# Patient Record
Sex: Female | Born: 1955
Health system: Southern US, Community
[De-identification: ages and names within clinical notes are randomized; demographics above are authoritative.]

## PROBLEM LIST (undated history)

## (undated) DIAGNOSIS — M5020 Other cervical disc displacement, unspecified cervical region: Secondary | ICD-10-CM

## (undated) DIAGNOSIS — I10 Essential (primary) hypertension: Secondary | ICD-10-CM

## (undated) DIAGNOSIS — L409 Psoriasis, unspecified: Secondary | ICD-10-CM

## (undated) HISTORY — DX: Essential (primary) hypertension: I10

## (undated) HISTORY — PX: BREAST CYST ASPIRATION: SHX578

## (undated) HISTORY — PX: TUBAL LIGATION: SHX77

## (undated) HISTORY — DX: Psoriasis, unspecified: L40.9

## (undated) HISTORY — DX: Other cervical disc displacement, unspecified cervical region: M50.20

---

## 2013-08-04 LAB — RAPID HIV SCREEN (HIV 1/2 AB+AG): HIV 1/2 ANTIBODIES: NONREACTIVE

## 2013-08-04 LAB — BASIC METABOLIC PANEL
BUN: 11 mg/dL (ref 4–21)
CREATININE: 0.7 mg/dL (ref 0.5–1.1)
Potassium: 3.9 mmol/L (ref 3.4–5.3)
Sodium: 139 mmol/L (ref 137–147)

## 2013-08-04 LAB — HEPATITIS C ANTIBODY: HEP C VIRUS AB: NEGATIVE

## 2013-08-04 LAB — COMPLETE METABOLIC PANEL WITH GFR: CHLORIDE: 101 mmol/L

## 2013-08-04 LAB — TSH: TSH: 1.17 u[IU]/mL (ref 0.41–5.90)

## 2013-08-04 LAB — HEMOGLOBIN A1C: Hgb A1c MFr Bld: 5.6 % (ref 4.0–6.0)

## 2013-08-05 LAB — T4, FREE: T4,Free (Direct): 0.8

## 2013-08-21 LAB — FECAL OCCULT BLOOD, GUAIAC: Fecal Occult Blood: NEGATIVE

## 2013-08-26 LAB — HM MAMMOGRAPHY: HM Mammogram: NEGATIVE

## 2013-12-09 LAB — HEPATIC FUNCTION PANEL
ALT: 17 U/L (ref 7–35)
AST: 20 U/L (ref 13–35)
Alkaline Phosphatase: 43 U/L (ref 25–125)
Bilirubin, Total: 0.6 mg/dL

## 2013-12-09 LAB — BASIC METABOLIC PANEL
CREATININE: 0.5 mg/dL (ref 0.5–1.1)
Glucose: 119 mg/dL
Potassium: 4.2 mmol/L (ref 3.4–5.3)
SODIUM: 141 mmol/L (ref 137–147)

## 2014-08-06 LAB — HEPATIC FUNCTION PANEL
ALT: 28 U/L (ref 7–35)
AST: 27 U/L (ref 13–35)
Alkaline Phosphatase: 46 U/L (ref 25–125)
BILIRUBIN, TOTAL: 0.4 mg/dL

## 2014-08-06 LAB — LIPID PANEL
Cholesterol: 173 mg/dL (ref 0–200)
HDL: 88 mg/dL — AB (ref 35–70)
LDL Cholesterol: 76 mg/dL
Triglycerides: 44 mg/dL (ref 40–160)

## 2014-08-06 LAB — TSH: TSH: 1.14 u[IU]/mL (ref 0.41–5.90)

## 2014-08-06 LAB — BASIC METABOLIC PANEL
Creatinine: 0.6 mg/dL (ref 0.5–1.1)
POTASSIUM: 4.2 mmol/L (ref 3.4–5.3)
Sodium: 140 mmol/L (ref 137–147)

## 2014-09-22 ENCOUNTER — Ambulatory Visit (INDEPENDENT_AMBULATORY_CARE_PROVIDER_SITE_OTHER): Payer: PRIVATE HEALTH INSURANCE | Admitting: Family Medicine

## 2014-09-22 ENCOUNTER — Encounter: Payer: Self-pay | Admitting: Family Medicine

## 2014-09-22 VITALS — BP 121/73 | HR 73 | Ht 66.0 in | Wt 140.0 lb

## 2014-09-22 DIAGNOSIS — M722 Plantar fascial fibromatosis: Secondary | ICD-10-CM

## 2014-09-22 DIAGNOSIS — G4733 Obstructive sleep apnea (adult) (pediatric): Secondary | ICD-10-CM | POA: Diagnosis not present

## 2014-09-22 DIAGNOSIS — G5603 Carpal tunnel syndrome, bilateral upper limbs: Secondary | ICD-10-CM | POA: Insufficient documentation

## 2014-09-22 DIAGNOSIS — Z1231 Encounter for screening mammogram for malignant neoplasm of breast: Secondary | ICD-10-CM

## 2014-09-22 DIAGNOSIS — L409 Psoriasis, unspecified: Secondary | ICD-10-CM

## 2014-09-22 DIAGNOSIS — G5601 Carpal tunnel syndrome, right upper limb: Secondary | ICD-10-CM

## 2014-09-22 DIAGNOSIS — G5600 Carpal tunnel syndrome, unspecified upper limb: Secondary | ICD-10-CM | POA: Insufficient documentation

## 2014-09-22 MED ORDER — FLUTICASONE PROPIONATE 50 MCG/ACT NA SUSP
1.0000 | Freq: Every day | NASAL | Status: DC
Start: 2014-09-22 — End: 2016-01-04

## 2014-09-22 MED ORDER — CLOBETASOL PROPIONATE 0.05 % EX CREA: 1.0000 "application " | TOPICAL_CREAM | Freq: Every day | CUTANEOUS | Status: DC | PRN

## 2014-09-22 MED ORDER — LORAZEPAM 0.5 MG PO TABS: 0.5000 mg | ORAL_TABLET | Freq: Every day | ORAL | Status: DC | PRN

## 2014-09-22 NOTE — Progress Notes (Signed)
Subjective:    Patient ID: Desiree Hughes, female    DOB: 12/26/1955, 59 y.o.   MRN: [id number]  HPI  patient recently moved to [place] and is here to establish primary care. She has a past medical history significant for sleep apnea and she does wear CPAP. She also has a herniated disc and has had blood pressure problems in the past.      Sleep apnea - on CPAP for about 4 years.  Took trazodone for 5-6 years and doesn't feel ike it really worked well and then would feel groggy in the Am.    Anxiety /panic attacks.  She uses it 2-3 times per week. Says occ uses it for sleep too.  Says she worries a fair amount.     She also reports a history of bilateral plantar fasciitis as well as right carpal tunnel syndrome. She's actually been having some pain in the pinky on the left hand recently. She scratches it is more of a sharp intermittent pain and when she rubs it feels better. No numbness or tickling in the pinky.   She also complains of a rash in the left antecubital fossa. She says initially it was a few blisters and then spread to a confluent erythematous area. She said there was never any drainage or weeping. It has been getting a little bit better. She's been applying some calamine lotion.  Review of Systems  Constitutional: Negative for fever, diaphoresis and unexpected weight change.  HENT: Positive for hearing loss. Negative for rhinorrhea and tinnitus.   Eyes: Negative for visual disturbance.  Respiratory: Negative for cough and wheezing.   Cardiovascular: Negative for chest pain and palpitations.  Gastrointestinal: Negative for nausea, vomiting, diarrhea and blood in stool.  Genitourinary: Negative for vaginal bleeding, vaginal discharge and difficulty urinating.       + nighttime urination  Musculoskeletal: Positive for myalgias. Negative for arthralgias.  Skin: Negative for rash.  Neurological: Positive for headaches.  Hematological: Negative for adenopathy. Does not  bruise/bleed easily.  Psychiatric/Behavioral: Positive for sleep disturbance. Negative for dysphoric mood. The patient is nervous/anxious.    BP 121/73 mmHg  Pulse 73  Ht  (1.676 m)  Wt 140 lb (63.504 kg)  BMI 22.61 kg/m2    Allergies  Allergen Reactions  . Lisinopril Cough    Past Medical History  Diagnosis Date  . Hypertension   . Herniated disc, cervical     Past Surgical History  Procedure Laterality Date  . Tubal ligation      History   Social History  . Marital Status: Married    Spouse Name: N/A  . Number of Children: N/A  . Years of Education: N/A   Occupational History  . Not on file.   Social History Main Topics  . Smoking status: Never Smoker   . Smokeless tobacco: Not on file  . Alcohol Use: No  . Drug Use: No  . Sexual Activity: No   Other Topics Concern  . Not on file   Social History Narrative  . No narrative on file    Family History  Problem Relation Age of Onset  . Alcohol abuse Father   . Cancer Father   . Heart disease Father   . Heart disease Brother   . Depression Son   . Diabetes Son     Outpatient Encounter Prescriptions as of 09/22/2014  Medication Sig  . B Complex-C (SUPER B COMPLEX PO) Take by mouth.  . betamethasone valerate (  VALISONE) 0.1 % cream Apply topically daily.  Marland Kitchen. BIOTIN PO Take by mouth.  Marland Kitchen. CALCIUM PO Take by mouth.  . Cholecalciferol (VITAMIN D-3 PO) Take by mouth.  . clobetasol cream (TEMOVATE) 0.05 % Apply 1 application topically daily as needed.  . Flax OIL Take by mouth.  . fluticasone (FLONASE) 50 MCG/ACT nasal spray Place 1 spray into both nostrils daily.  Marland Kitchen. HALOBETASOL PROPIONATE EX Apply topically.  Marland Kitchen. HYDROcodone-acetaminophen (NORCO/VICODIN) 5-325 MG per tablet Take 1 tablet by mouth daily as needed for moderate pain.  Marland Kitchen. ibuprofen (ADVIL,MOTRIN) 800 MG tablet Take 800 mg by mouth every 8 (eight) hours as needed.  Marland Kitchen. LORazepam (ATIVAN) 0.5 MG tablet Take 1 tablet (0.5 mg total) by mouth daily as  needed for anxiety.  Marland Kitchen. MELATONIN PO Take by mouth.  Marland Kitchen. MILK THISTLE PO Take by mouth.  . Multiple Vitamins-Minerals (CENTRUM SILVER ULTRA WOMENS PO) Take by mouth.  . nystatin cream (MYCOSTATIN) Apply 1 application topically 2 (two) times daily.  . Omega-3 Fatty Acids (FISH OIL PO) Take by mouth.  . vitamin C (ASCORBIC ACID) 500 MG tablet Take 500 mg by mouth daily.  . [DISCONTINUED] clobetasol cream (TEMOVATE) 0.05 % Apply 1 application topically 2 (two) times daily.  . [DISCONTINUED] fluticasone (FLONASE) 50 MCG/ACT nasal spray Place 2 sprays into both nostrils daily.  . [DISCONTINUED] LORazepam (ATIVAN) 0.5 MG tablet Take 0.5 mg by mouth daily as needed for anxiety.   No facility-administered encounter medications on file as of 09/22/2014.           Objective:   Physical Exam  Constitutional: She is oriented to person, place, and time. She appears well-developed and well-nourished.  HENT:  Head: Normocephalic and atraumatic.  Cardiovascular: Normal rate, regular rhythm and normal heart sounds.   Pulmonary/Chest: Effort normal and breath sounds normal.  Neurological: She is alert and oriented to person, place, and time.  Skin: Skin is warm and dry.  eirythematous raised red rash on left antecubetal fossa.   Psychiatric: She has a normal mood and affect. Her behavior is normal.          Assessment & Plan:  Sleep apnea -  We'll work on trying to get copy of sleep study so that we can place referral so she can start to get new supplies etc. Here locally. In that way if we need to do a download yearly we can work through the local agency to do that.  Rash - likely poison ivey.   Will refill topical sterile cream to use as needed. She's used these before for her psoriasis and his her with those.  Mammogram due - order placed today. They will contact her.    Anxiety/panic attacks.   She's currently using her  Ativan a few times a week. Reminded her about the potential for  dependency and to make sure that she's using it sparingly. If she starts noticing that she's using it more frequently than encouraged her to come and make an appointment and we can discuss on putting her on something prophylactically. I did refill the medication today.

## 2014-10-07 ENCOUNTER — Encounter: Payer: Self-pay | Admitting: Family Medicine

## 2014-10-07 DIAGNOSIS — G894 Chronic pain syndrome: Secondary | ICD-10-CM | POA: Insufficient documentation

## 2014-10-07 DIAGNOSIS — K219 Gastro-esophageal reflux disease without esophagitis: Secondary | ICD-10-CM | POA: Insufficient documentation

## 2014-10-14 ENCOUNTER — Ambulatory Visit (INDEPENDENT_AMBULATORY_CARE_PROVIDER_SITE_OTHER): Payer: PRIVATE HEALTH INSURANCE

## 2014-10-14 ENCOUNTER — Other Ambulatory Visit: Payer: Self-pay | Admitting: Family Medicine

## 2014-10-14 DIAGNOSIS — Z1231 Encounter for screening mammogram for malignant neoplasm of breast: Secondary | ICD-10-CM

## 2014-10-14 MED ORDER — AMBULATORY NON FORMULARY MEDICATION: Status: DC

## 2014-10-29 ENCOUNTER — Telehealth: Payer: Self-pay | Admitting: Family Medicine

## 2014-10-29 MED ORDER — NYSTATIN-TRIAMCINOLONE 100000-0.1 UNIT/GM-% EX OINT: 1.0000 "application " | TOPICAL_OINTMENT | Freq: Two times a day (BID) | CUTANEOUS | Status: DC

## 2014-10-29 NOTE — Telephone Encounter (Signed)
Pt informed. Pt asked that a rx for nystatin-triamcinolone cream be sent to her pharmacy. She stated that her previous dr gave her this and forgot to ask for refill when she came in. rx sent.Desiree Hughes, Desiree Hughes .Desiree Hughes, Desiree Hughes

## 2014-10-29 NOTE — Telephone Encounter (Signed)
Call pt: I received compliance report for CPAP and looks like she is doing a great job using it.  Her pressure is set to 8.0 cmH20 and is working well to resolve her apneas

## 2014-11-04 ENCOUNTER — Encounter: Payer: Self-pay | Admitting: Family Medicine

## 2014-11-17 ENCOUNTER — Encounter: Payer: Self-pay | Admitting: Family Medicine

## 2014-12-29 ENCOUNTER — Encounter: Payer: Self-pay | Admitting: Family Medicine

## 2014-12-29 ENCOUNTER — Ambulatory Visit (INDEPENDENT_AMBULATORY_CARE_PROVIDER_SITE_OTHER): Payer: PRIVATE HEALTH INSURANCE | Admitting: Family Medicine

## 2014-12-29 VITALS — BP 102/62 | HR 71 | Temp 98.3°F | Ht 66.0 in | Wt 143.0 lb

## 2014-12-29 DIAGNOSIS — M47812 Spondylosis without myelopathy or radiculopathy, cervical region: Secondary | ICD-10-CM

## 2014-12-29 DIAGNOSIS — S161XXA Strain of muscle, fascia and tendon at neck level, initial encounter: Secondary | ICD-10-CM

## 2014-12-29 DIAGNOSIS — G4700 Insomnia, unspecified: Secondary | ICD-10-CM | POA: Diagnosis not present

## 2014-12-29 DIAGNOSIS — G4733 Obstructive sleep apnea (adult) (pediatric): Secondary | ICD-10-CM | POA: Diagnosis not present

## 2014-12-29 DIAGNOSIS — Z23 Encounter for immunization: Secondary | ICD-10-CM

## 2014-12-29 DIAGNOSIS — Z8659 Personal history of other mental and behavioral disorders: Secondary | ICD-10-CM | POA: Insufficient documentation

## 2014-12-29 DIAGNOSIS — F411 Generalized anxiety disorder: Secondary | ICD-10-CM

## 2014-12-29 DIAGNOSIS — Z9989 Dependence on other enabling machines and devices: Secondary | ICD-10-CM

## 2014-12-29 MED ORDER — SUVOREXANT 15 MG PO TABS: 15.0000 mg | ORAL_TABLET | Freq: Every evening | ORAL | Status: DC | PRN

## 2014-12-29 NOTE — Progress Notes (Addendum)
   Subjective:    Patient ID: Desiree Hughes, female    DOB: 1955/05/21, 59 y.o.   MRN: [id number]  HPI  Not sleeping well. Hard time falling asleep and staying asleep. Walks aobut 5 miels per day. Wears her CPAP regularly. Still not sleping well. Only getting about 3 hours per night.  Says starting to feel lonely now that there is not family around.  She does use melatonin.  Says the ambien caused drosiness. Was on trazodone for years. Made her feel groggy as well.  Just feels really tired all the time.     Complains of Neck Pain-  For several weeks.  Says it triggering headaches for her. She's been taking ibuprofen and Aleve but really feels like she's been taking it too much. No specific injuries but she says she has arthritis and degenerative disc disease in her neck that she's aware of.  Anxiety-she is doing well overall. She still takes her lorazepam about twice per week. Most the time is to help her sleep. Not really for panic attacks.  Review of Systems     Objective:   Physical Exam  Constitutional: She is oriented to person, place, and time. She appears well-developed and well-nourished.  HENT:  Head: Normocephalic and atraumatic.  Cardiovascular: Normal rate, regular rhythm and normal heart sounds.   Pulmonary/Chest: Effort normal and breath sounds normal.  Musculoskeletal:  Normal cervical flexion and extension. Decreased rotation to the right compared the left. Normal side bending right and left.  Neurological: She is alert and oriented to person, place, and time.  Skin: Skin is warm and dry.  Psychiatric: She has a normal mood and affect. Her behavior is normal.          Assessment & Plan:  Insomnia  - we'll try sending her prescription for Belsomra. May not be covered with her insurance. She had side effects with trazodone and Ambien. We reviewed some good sleep hygiene. Reactions sounds like she is really doing great as far as that is concerned. She limits any caffeine  after 10 AM. To trust ago to bed and get she feels like her sleep environment is conducive to sleep. She says occasionally it's challenging if her forget husband forgets to wear his CPAP.  OSA -  She was able to get her supplies etc. And has been wearing her  CPAP consistently. Though only getting a few hours of sleep.   Cervical pain.  Recommend referral to physical therapy for further evaluation and treatment. This should also improve her headaches as well. If not improving after 4-5 weeks of physical therapy consider further imaging.   GAD - gad 7 score of 17. Rates her symptoms is somewhat difficult. Says that she really may benefit from being on something to help reduce her overall anxiety.

## 2014-12-31 ENCOUNTER — Ambulatory Visit (INDEPENDENT_AMBULATORY_CARE_PROVIDER_SITE_OTHER): Payer: No Typology Code available for payment source | Admitting: Rehabilitative and Restorative Service Providers"

## 2014-12-31 ENCOUNTER — Encounter: Payer: Self-pay | Admitting: Rehabilitative and Restorative Service Providers"

## 2014-12-31 DIAGNOSIS — R293 Abnormal posture: Secondary | ICD-10-CM

## 2014-12-31 DIAGNOSIS — M436 Torticollis: Secondary | ICD-10-CM

## 2014-12-31 DIAGNOSIS — G44229 Chronic tension-type headache, not intractable: Secondary | ICD-10-CM | POA: Diagnosis not present

## 2014-12-31 DIAGNOSIS — Z7409 Other reduced mobility: Secondary | ICD-10-CM | POA: Diagnosis not present

## 2014-12-31 NOTE — Patient Instructions (Signed)
Axial Extension (Chin Tuck)   Pull chin in and lengthen back of neck. Hold __10__ seconds while counting out loud. Repeat __10__ times. Do __several__ sessions per day.       Scapular Retraction (Standing)   With arms at sides, pinch shoulder blades together. Hold 10 sec Repeat _10__ times per set. Do __1-3__ sets per session. Do _several ___ sessions per day. Can use pool noodle  Scapula Adduction With Pectorals, Low   Stand in doorframe with palms against frame and arms at 45. Lean forward and squeeze shoulder blades. Hold _20__ seconds. Repeat _3__ times per session. Do _3__ sessions per day.  .    Scapula Adduction With Pectorals, Mid-Range   Stand in doorframe with palms against frame and arms at 90. Lean forward and squeeze shoulder blades. Hold _20__ seconds. Repeat _3__ times per session. Do _2-3__ sessions per day.    Scapula Adduction With Pectorals, High   Stand in doorframe with palms against frame and arms at 120. Lean forward and squeeze shoulder blades. Hold 20__ seconds. Repeat _3__ times per session. Do 2-3___ sessions per day.

## 2014-12-31 NOTE — Therapy (Signed)
[facility] [place] [address] [place], [place], [address] Phone: [phone]   Fax:  [phone]  Physical Therapy Evaluation  Patient Details  Name: Desiree Hughes MRN: [id number] Date of Birth: 1955/05/09 Referring Provider:  Agapito Games, *  Encounter Date: 12/31/2014      PT End of Session - 12/31/14 1306    Visit Number 1   Number of Visits 12   Date for PT Re-Evaluation 02/11/15   PT Start Time 1021   PT Stop Time 1114   PT Time Calculation (min) 53 min   Activity Tolerance Patient tolerated treatment well      Past Medical History  Diagnosis Date  . Hypertension   . Herniated disc, cervical     Past Surgical History  Procedure Laterality Date  . Tubal ligation      There were no vitals filed for this visit.  Visit Diagnosis:  Chronic tension-type headache, not intractable - Plan: PT plan of care cert/re-cert  Stiff neck - Plan: PT plan of care cert/re-cert  Abnormal posture - Plan: PT plan of care cert/re-cert  Decreased mobility and endurance - Plan: PT plan of care cert/re-cert      Subjective Assessment - 12/31/14 1025    Subjective Patient reports that hse has had neck problems for several years but has noticed increaed tightness and onset of HA's in past two months. She has some dizziness if she tilts her neck too far back.    Pertinent History History of sciatica; HNP lumbar spine no surgery; insomnia/CPAP   How long can you sit comfortably? 1- 1 1/2 hours    How long can you stand comfortably? 15-20 min    How long can you walk comfortably? no limit    Diagnostic tests none - xrays years ago showed arthritis neck and shoulder   Patient Stated Goals get rid of HA's and improve flexibility of neck            [facility] PT Assessment - 12/31/14 0001    Assessment   Medical Diagnosis Cervical strain   Onset Date/Surgical Date 11/07/14   Hand Dominance Right   Next MD Visit 6 months   Prior Therapy none   Precautions   Precautions None   Balance Screen   Has the patient fallen in the past 6 months No   Has the patient had a decrease in activity level because of a fear of falling?  No   Is the patient reluctant to leave their home because of a fear of falling?  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[employer], [employer]    Leisure walking 3-5 miles/day; household chores; cooking   Observation/Other Assessments   Focus on Therapeutic Outcomes (FOTO)  54% limitation   Sensation   Additional Comments WFL's   Posture/Postural Control   Posture Comments head forward; shoulders rounded and elevated; head of the humerus anterior in orientation; increased thoracic kyphosis   AROM   Overall AROM Comments shoulder ROM  WFL's throughout   Cervical Flexion 34   Cervical Extension 51   Cervical - Right Side Bend 25   Cervical - Left Side Bend 34   Cervical - Right Rotation 48   Cervical - Left Rotation 40   Strength   Overall Strength Comments 5/5 bilat UE's  Palpation   Palpation comment muscular tightness ant/lat/post cervical musculature; upper traps; leveator; occipital area; pecs bilat                   [facility] Adult PT Treatment/Exercise - 12/31/14 0001    Self-Care   Self-Care --  postural correction/ergonomic modifications   Neck Exercises: Standing   Neck Retraction 5 reps;10 secs   Shoulder Exercises: Stretch   Corner Stretch Limitations doorway 3 positions 30 sec hold 2 reps each   Other Shoulder Stretches scap squeeze with noodle 10 sec hold 10 reps   Moist Heat Therapy   Number Minutes Moist Heat 15 Minutes   Moist Heat Location Cervical  thoracic   Electrical Stimulation   Electrical Stimulation Location cervical upper trap bilat   Electrical Stimulation Action IFC   Electrical  Stimulation Parameters to tolerance   Electrical Stimulation Goals Pain  muscular tightness                PT Education - 12/31/14 1104    Education provided Yes   Education Details Postural correction; HEP; ergonomic correction for home   Person(s) Educated Patient   Methods Explanation;Demonstration;Tactile cues;Verbal cues;Handout   Comprehension Verbalized understanding;Returned demonstration;Verbal cues required;Tactile cues required             PT Long Term Goals - 12/31/14 1313    PT LONG TERM GOAL #1   Title Patient I in HEP for dischage 02/11/15   Time 6   Period Weeks   Status New   PT LONG TERM GOAL #2   Title Cervical ROM WFL's and pain free 02/11/15   Time 6   Period Weeks   Status New   PT LONG TERM GOAL #3   Title improve posture and alignment with good cervical and thoracic alignment 02/11/15   Time 6   Period Weeks   Status New   PT LONG TERM GOAL #4   Title decrease frequency, intensity, duration of headaches 02/11/15   Time 6   Period Weeks   PT LONG TERM GOAL #5   Title Decrease FOTO to </=41% limitation 02/11/15   Time 6   Period Weeks   Status New               Plan - 12/31/14 1307    Clinical Impression Statement Patient presents with c/o headaches and tightness through the neck and shoulders. She presents with poor postrure and alignment; limited cervical ROM and mobility; muscular tenderness and tightness to palpation; limited activity level related to headaches. She will benefit from Physical Therpay to address muscular tightness and limited mobility to help with decreasing frequency, intensity and intensity of headaches.   Pt will benefit from skilled therapeutic intervention in order to improve on the following deficits Decreased range of motion;Decreased mobility;Decreased activity tolerance;Decreased endurance;Pain   Rehab Potential Good   PT Frequency 2x / week   PT Duration 6 weeks   PT Treatment/Interventions  Patient/family education;ADLs/Self Care Home Management;Therapeutic exercise;Therapeutic activities;Manual techniques;Cryotherapy;Electrical Stimulation;Moist Heat;Ultrasound;Traction;Dry needling   PT Next Visit Plan trial of manual therapy to address soft tissue tightness; progress with postural strengthening as tolerated; continue education re-spine care; postural correction   PT Home Exercise Plan postural correction; HEP   Consulted and Agree with Plan of Care Patient         Problem List Patient Active Problem List   Diagnosis Date Noted  . GAD (generalized anxiety disorder) 12/29/2014  . GERD (gastroesophageal reflux disease) 10/07/2014  .  Chronic pain syndrome 10/07/2014  . Carpal tunnel syndrome 09/22/2014  . Plantar fasciitis, bilateral 09/22/2014  . Obstructive sleep apnea 09/22/2014  . Psoriasis 09/22/2014    Selina Tapper Rober Minion PT, MPH 12/31/2014, 1:27 PM  [facility] [address] [place], [place], [address] Phone: [phone]   Fax:  [phone]

## 2015-01-05 ENCOUNTER — Telehealth: Payer: Self-pay | Admitting: Family Medicine

## 2015-01-05 ENCOUNTER — Ambulatory Visit (INDEPENDENT_AMBULATORY_CARE_PROVIDER_SITE_OTHER): Payer: PRIVATE HEALTH INSURANCE | Admitting: Physical Therapy

## 2015-01-05 DIAGNOSIS — R293 Abnormal posture: Secondary | ICD-10-CM | POA: Diagnosis not present

## 2015-01-05 DIAGNOSIS — Z7409 Other reduced mobility: Secondary | ICD-10-CM | POA: Diagnosis not present

## 2015-01-05 DIAGNOSIS — M436 Torticollis: Secondary | ICD-10-CM | POA: Diagnosis not present

## 2015-01-05 DIAGNOSIS — G44229 Chronic tension-type headache, not intractable: Secondary | ICD-10-CM | POA: Diagnosis not present

## 2015-01-05 NOTE — Therapy (Signed)
[place], [place], [address] Phone: [phone]   Fax:  [phone]  Physical Therapy Treatment  Patient Details  Name: Desiree Hughes MRN: [id number] Date of Birth: 11/02/1955 Referring Provider:  Hali Marry, *  Encounter Date: 01/05/2015      PT End of Session - 01/05/15 1019    Visit Number 2   Number of Visits 12   Date for PT Re-Evaluation 02/11/15   PT Start Time 1018   PT Stop Time 1119   PT Time Calculation (min) 61 min      Past Medical History  Diagnosis Date  . Hypertension   . Herniated disc, cervical     Past Surgical History  Procedure Laterality Date  . Tubal ligation      There were no vitals filed for this visit.  Visit Diagnosis:  Chronic tension-type headache, not intractable  Stiff neck  Decreased mobility and endurance  Abnormal posture      Subjective Assessment - 01/05/15 1021    Subjective Pt woke up with a headache and it now it is starting to subside. "Neck feels a little more creeky this week".   She reports she is unable to find pool noodle at store and that the chest stretches have caused some increased soreness.      Currently in Pain? Yes   Pain Score 2    Pain Location Neck   Pain Orientation Right;Posterior   Pain Descriptors / Indicators Aching   Aggravating Factors  housework, yardwork    Pain Relieving Factors MHP           [facility] Adult PT Treatment/Exercise - 01/05/15 0001    Exercises   Exercises Neck;Shoulder   Neck Exercises: Machines for Strengthening   Other Machines for Strengthening NuStep with arms/legs L5: 5 min    Neck Exercises: Supine   Upper Extremity D2 10 reps;Flexion;Theraband   Theraband Level (UE D2) Level 2 (Red)   Other Supine Exercise shoulder press/thoracic lift x 3 sec x 10 reps    Other Supine Exercise Head presses x 3 sec hold x 10 reps;  Hooklying on 1/2 foam roll:  bila shoulder abd stretch x 1  min, then 10 snow angel,  horizontal abd with yellow band x 10   Shoulder Exercises: Supine   External Rotation Strengthening;Both;15 reps;Theraband  2 sets   Theraband Level (Shoulder External Rotation) Level 1 (Yellow);Level 2 (Red)   Flexion Strengthening;Both;10 reps;Theraband   Shoulder Exercises: Seated   Other Seated Exercises D2 flexion with yellow band x 5 each rep    Moist Heat Therapy   Number Minutes Moist Heat 15 Minutes   Moist Heat Location Cervical  thoracic   Electrical Stimulation   Electrical Stimulation Location cervical upper trap bilat   Electrical Stimulation Action IFC   Electrical Stimulation Parameters to tolerance   Electrical Stimulation Goals Pain   Manual Therapy   Manual Therapy Myofascial release   Manual therapy comments Noted tight/point tender in Rt cervical paraspinals ~C4 and upper trap, suboccipital   Myofascial Release to bilateral cervical paraspinals and suboccitipal muscles; gentle suboccipital release and manual traction.    Neck Exercises: Stretches   Neck Stretch 2 reps;10 seconds  lateral neck flexion            PT Education - 01/05/15 1227    Education provided Yes   Education Details HEP    Person(s) Educated Patient   Methods Explanation;Demonstration;Handout   Comprehension  Returned demonstration;Verbalized understanding             PT Long Term Goals - 12/31/14 1313    PT LONG TERM GOAL #1   Title Patient I in HEP for dischage 02/11/15   Time 6   Period Weeks   Status New   PT LONG TERM GOAL #2   Title Cervical ROM WFL's and pain free 02/11/15   Time 6   Period Weeks   Status New   PT LONG TERM GOAL #3   Title improve posture and alignment with good cervical and thoracic alignment 02/11/15   Time 6   Period Weeks   Status New   PT LONG TERM GOAL #4   Title decrease frequency, intensity, duration of headaches 02/11/15   Time 6   Period Weeks   PT LONG TERM GOAL #5   Title Decrease FOTO to </=41%  limitation 02/11/15   Time 6   Period Weeks   Status New               Plan - 01/05/15 1227    Clinical Impression Statement Pt tolerated all exercises well, with minimal increase in pain.  Pt had some increased neck tightness, but this subsided with rest.   Pt reported decreased pain/tightness in neck with manual therapy and MHP/estim.  No goals met, only second visit .   Pt will benefit from skilled therapeutic intervention in order to improve on the following deficits Decreased range of motion;Decreased mobility;Decreased activity tolerance;Decreased endurance;Pain   Rehab Potential Good   PT Frequency 2x / week   PT Duration 6 weeks   PT Treatment/Interventions Patient/family education;ADLs/Self Care Home Management;Therapeutic exercise;Therapeutic activities;Manual techniques;Cryotherapy;Electrical Stimulation;Moist Heat;Ultrasound;Traction;Dry needling   PT Next Visit Plan progress with postural strengthening as tolerated; continue education re-spine care; postural correction; manual therapy to address soft tissue tightness.    Consulted and Agree with Plan of Care Patient        Problem List Patient Active Problem List   Diagnosis Date Noted  . GAD (generalized anxiety disorder) 12/29/2014  . GERD (gastroesophageal reflux disease) 10/07/2014  . Chronic pain syndrome 10/07/2014  . Carpal tunnel syndrome 09/22/2014  . Plantar fasciitis, bilateral 09/22/2014  . Obstructive sleep apnea 09/22/2014  . Psoriasis 09/22/2014    Kerin Perna, PTA 01/05/2015 12:33 PM  [place], [place], [address] Phone: [phone]   Fax:  [phone]

## 2015-01-05 NOTE — Telephone Encounter (Signed)
Received fax for prior authorization on Belsomra sent through cover my meds waiting on authorization. - CF °

## 2015-01-05 NOTE — Patient Instructions (Signed)
Over Head Pull: Narrow Grip     [place] [phone]   On back, knees bent, feet flat, band across thighs, elbows straight but relaxed. Pull hands apart (start). Keeping elbows straight, bring arms up and over head, hands toward floor. Keep pull steady on band. Hold momentarily. Return slowly, keeping pull steady, back to start. Repeat __10_ times. Band color __red____   Side Pull: Double Arm   On back, knees bent, feet flat. Arms perpendicular to body, shoulder level, elbows straight but relaxed. Pull arms out to sides, elbows straight. Resistance band comes across collarbones, hands toward floor. Hold momentarily. Slowly return to starting position. Repeat _10__ times. Band color __red ___   Elisabeth Cara   On back, knees bent, feet flat, left hand on left hip, right hand above left. Pull right arm DIAGONALLY (hip to shoulder) across chest. Bring right arm along head toward floor. Hold momentarily. Slowly return to starting position. (thumb up)  Repeat __10 _ times. Do with left arm. Band color __red____   Shoulder Rotation: Double Arm   On back, knees bent, feet flat, elbows tucked at sides, bent 90, hands palms up. Pull hands apart and down toward floor, keeping elbows near sides. Hold momentarily. Slowly return to starting position. Repeat _10__ times. Band color __red____    [facility] [address] [place], [place] [address]  [phone] (office) [phone] (fax)

## 2015-01-07 ENCOUNTER — Ambulatory Visit (INDEPENDENT_AMBULATORY_CARE_PROVIDER_SITE_OTHER): Payer: PRIVATE HEALTH INSURANCE | Admitting: Rehabilitative and Restorative Service Providers"

## 2015-01-07 ENCOUNTER — Encounter: Payer: Self-pay | Admitting: Rehabilitative and Restorative Service Providers"

## 2015-01-07 DIAGNOSIS — G44229 Chronic tension-type headache, not intractable: Secondary | ICD-10-CM

## 2015-01-07 DIAGNOSIS — Z7409 Other reduced mobility: Secondary | ICD-10-CM

## 2015-01-07 DIAGNOSIS — M436 Torticollis: Secondary | ICD-10-CM | POA: Diagnosis not present

## 2015-01-07 DIAGNOSIS — R293 Abnormal posture: Secondary | ICD-10-CM

## 2015-01-07 NOTE — Therapy (Signed)
[facility] [place] [address] [place], [place], [address] Phone: [phone]   Fax:  [phone]  Physical Therapy Treatment  Patient Details  Name: Desiree Hughes MRN: [id number] Date of Birth: [birth year] Referring Provider:  Agapito Games, *  Encounter Date: 01/07/2015      PT End of Session - 01/07/15 1333    Visit Number 3   Number of Visits 12   Date for PT Re-Evaluation 02/11/15   PT Start Time 0932   PT Stop Time 1029   PT Time Calculation (min) 57 min   Activity Tolerance Patient tolerated treatment well      Past Medical History  Diagnosis Date  . Hypertension   . Herniated disc, cervical     Past Surgical History  Procedure Laterality Date  . Tubal ligation      There were no vitals filed for this visit.  Visit Diagnosis:  Chronic tension-type headache, not intractable  Stiff neck  Decreased mobility and endurance  Abnormal posture      Subjective Assessment - 01/07/15 0939    Subjective Severe HA yesterday just mild today    Pain Score 2    Pain Location Head   Pain Orientation Right;Posterior   Pain Descriptors / Indicators Aching                         [facility] Adult PT Treatment/Exercise - 01/07/15 0001    Neck Exercises: Machines for Strengthening   Other Machines for Strengthening NuStep with arms/legs L5: 5 min    Neck Exercises: Standing   Neck Retraction 5 reps;10 secs   Shoulder Exercises: Standing   External Rotation Strengthening;Both;20 reps;Theraband   Theraband Level (Shoulder External Rotation) Level 2 (Red)   Extension Strengthening;Both;10 reps;Theraband   Theraband Level (Shoulder Extension) Level 2 (Red)   Row Strengthening;Both;10 reps;Theraband   Theraband Level (Shoulder Row) Level 2 (Red)   Other Standing Exercises scap squeeze 10 sec x 10 with noodle   Shoulder Exercises: Stretch   Corner Stretch Limitations doorway 3 positions 30 sec hold  2 reps each   Other Shoulder Stretches scap squeeze with noodle 10 sec hold 10 reps   Moist Heat Therapy   Number Minutes Moist Heat 15 Minutes   Moist Heat Location Cervical  thoracic   Electrical Stimulation   Electrical Stimulation Location cervical upper trap bilat   Electrical Stimulation Action IFC   Electrical Stimulation Parameters to tolerance   Electrical Stimulation Goals Pain   Manual Therapy   Manual Therapy Myofascial release   Soft tissue mobilization bilat ant/lat/post cervical musculature; upper traps    Myofascial Release chest/cervical    Passive ROM passive stretch c-spine into flexion/lateral flexion/scalenes  3 reps 30 sec hold    Manual Traction cervical traction 30-45 sec hold 2-3 reps                PT Education - 01/07/15 0946    Education provided Yes   Education Details postrure/alignment/HEP   Person(s) Educated Patient   Methods Explanation;Demonstration;Tactile cues;Verbal cues;Handout   Comprehension Verbalized understanding;Returned demonstration;Verbal cues required;Tactile cues required             PT Long Term Goals - 12/31/14 1313    PT LONG TERM GOAL #1   Title Patient I in HEP for dischage 02/11/15   Time 6   Period Weeks   Status New   PT LONG TERM GOAL #2   Title  Cervical ROM WFL's and pain free 02/11/15   Time 6   Period Weeks   Status New   PT LONG TERM GOAL #3   Title improve posture and alignment with good cervical and thoracic alignment 02/11/15   Time 6   Period Weeks   Status New   PT LONG TERM GOAL #4   Title decrease frequency, intensity, duration of headaches 02/11/15   Time 6   Period Weeks   PT LONG TERM GOAL #5   Title Decrease FOTO to </=41% limitation 02/11/15   Time 6   Period Weeks   Status New               Plan - 01/07/15 1334    Clinical Impression Statement Continue rehab focus on improving posture and alignment; decreasing muscular tightness through cervical and pec  musculature; increasing posterior shoulder girdle strength   Pt will benefit from skilled therapeutic intervention in order to improve on the following deficits Decreased range of motion;Decreased mobility;Decreased activity tolerance;Decreased endurance;Pain   Rehab Potential Good   PT Frequency 2x / week   PT Duration 6 weeks   PT Treatment/Interventions Patient/family education;ADLs/Self Care Home Management;Therapeutic exercise;Therapeutic activities;Manual techniques;Cryotherapy;Electrical Stimulation;Moist Heat;Ultrasound;Traction;Dry needling   PT Next Visit Plan continue education re-spine care; postural correction; manual therapy to address soft tissue tightness.    PT Home Exercise Plan postural correction; HEP   Consulted and Agree with Plan of Care Patient        Problem List Patient Active Problem List   Diagnosis Date Noted  . GAD (generalized anxiety disorder) 12/29/2014  . GERD (gastroesophageal reflux disease) 10/07/2014  . Chronic pain syndrome 10/07/2014  . Carpal tunnel syndrome 09/22/2014  . Plantar fasciitis, bilateral 09/22/2014  . Obstructive sleep apnea 09/22/2014  . Psoriasis 09/22/2014    Toneka Fullen Rober Minion PT, MPH 01/07/2015, 1:37 PM  [facility] [address] [place], [place], [address] Phone: [phone]   Fax:  [phone]

## 2015-01-07 NOTE — Patient Instructions (Signed)

## 2015-01-08 NOTE — Telephone Encounter (Signed)
Received fax from [facility] they have denied coverage on Belsomra due to patient has not tried and failed eszopidone. Reference # [id number]. - CF

## 2015-01-12 ENCOUNTER — Telehealth: Payer: Self-pay | Admitting: Family Medicine

## 2015-01-12 ENCOUNTER — Ambulatory Visit (INDEPENDENT_AMBULATORY_CARE_PROVIDER_SITE_OTHER): Payer: PRIVATE HEALTH INSURANCE | Admitting: Rehabilitative and Restorative Service Providers"

## 2015-01-12 ENCOUNTER — Encounter: Payer: Self-pay | Admitting: Rehabilitative and Restorative Service Providers"

## 2015-01-12 DIAGNOSIS — Z7409 Other reduced mobility: Secondary | ICD-10-CM | POA: Diagnosis not present

## 2015-01-12 DIAGNOSIS — M436 Torticollis: Secondary | ICD-10-CM | POA: Diagnosis not present

## 2015-01-12 DIAGNOSIS — R293 Abnormal posture: Secondary | ICD-10-CM

## 2015-01-12 DIAGNOSIS — G44229 Chronic tension-type headache, not intractable: Secondary | ICD-10-CM | POA: Diagnosis not present

## 2015-01-12 NOTE — Telephone Encounter (Signed)
Please call patient: Received notification from her prescription drug company that they will not cover the Belsomra. We had tried to do an appeal. She must try Lunesta first. If she is okay with doing so then we can send a prescription to her pharmacy.

## 2015-01-12 NOTE — Therapy (Signed)
[place], [place], [address] Phone: [phone]   Fax:  [phone]  Physical Therapy Treatment  Patient Details  Name: Desiree Hughes MRN: [id number] Date of Birth: 1955-05-30 Referring Provider:  Hali Marry, *  Encounter Date: 01/12/2015      PT End of Session - 01/12/15 1024    Visit Number 4   Number of Visits 12   Date for PT Re-Evaluation 02/11/15   PT Start Time 1022   PT Stop Time 1114   PT Time Calculation (min) 52 min   Activity Tolerance Patient tolerated treatment well      Past Medical History  Diagnosis Date  . Hypertension   . Herniated disc, cervical     Past Surgical History  Procedure Laterality Date  . Tubal ligation      There were no vitals filed for this visit.  Visit Diagnosis:  Chronic tension-type headache, not intractable  Stiff neck  Decreased mobility and endurance  Abnormal posture      Subjective Assessment - 01/12/15 1024    Subjective Doing well today. Worked on building a shed at home this weekend and suprisingly did really well.    Currently in Pain? No/denies            [facility] PT Assessment - 01/12/15 0001    AROM   Cervical Flexion 39   Cervical Extension 57   Cervical - Right Side Bend 30   Cervical - Left Side Bend 36   Cervical - Right Rotation 58   Cervical - Left Rotation 48                     [facility] Adult PT Treatment/Exercise - 01/12/15 0001    Shoulder Exercises: Standing   External Rotation Strengthening;Both;20 reps;Theraband   Theraband Level (Shoulder External Rotation) Level 2 (Red)   Extension Strengthening;Both;10 reps;Theraband   Theraband Level (Shoulder Extension) Level 2 (Red)   Row Strengthening;Both;10 reps;Theraband   Theraband Level (Shoulder Row) Level 2 (Red)   Other Standing Exercises scap squeeze 10 sec x 10 with noodle   Shoulder Exercises: ROM/Strengthening   UBE (Upper Arm  Bike) L2 1 min fwd/1back x2   Shoulder Exercises: Stretch   Corner Stretch Limitations doorway 3 positions 30 sec hold 2 reps each   Other Shoulder Stretches scap squeeze with noodle 10 sec hold 10 reps                     PT Long Term Goals - 01/12/15 1208    PT LONG TERM GOAL #1   Title Patient I in HEP for dischage 02/11/15   Period Weeks   Status On-going   PT LONG TERM GOAL #2   Title Cervical ROM WFL's and pain free 02/11/15   Baseline good gains in cervical mobility noted - see ROM measurements   Time 6   Period Weeks   Status On-going   PT LONG TERM GOAL #3   Title improve posture and alignment with good cervical and thoracic alignment 02/11/15   Time 6   Period Weeks   Status Partially Met   PT LONG TERM GOAL #4   Title decrease frequency, intensity, duration of headaches 02/11/15   Baseline decreased c/o headaches and headaches she has are more "mild"   Time 6   Period Weeks   Status On-going   PT LONG TERM GOAL #5   Title Decrease FOTO to </=41%  limitation 02/11/15   Time 6   Period Weeks   Status On-going               Plan - 01/12/15 1042    Clinical Impression Statement Progressing well with increassing cervical mobility/ROM; improving posture and alignment; decreasing pain. Progressing well toward goals for therapy.    Pt will benefit from skilled therapeutic intervention in order to improve on the following deficits Decreased range of motion;Decreased mobility;Decreased activity tolerance;Decreased endurance;Pain   Rehab Potential Good   Clinical Impairments Affecting Rehab Potential Progressing well with postural improvement; improving functional status with decreased pain.    PT Frequency 2x / week   PT Duration 6 weeks   PT Treatment/Interventions Patient/family education;ADLs/Self Care Home Management;Therapeutic exercise;Therapeutic activities;Manual techniques;Cryotherapy;Electrical Stimulation;Moist Heat;Ultrasound;Traction;Dry  needling   PT Next Visit Plan continue education re-spine care; postural correction; manual therapy to address soft tissue tightness; PROM/stretching for cervical musculature all planes.   PT Home Exercise Plan postural correction; HEP   Consulted and Agree with Plan of Care Patient        Problem List Patient Active Problem List   Diagnosis Date Noted  . GAD (generalized anxiety disorder) 12/29/2014  . GERD (gastroesophageal reflux disease) 10/07/2014  . Chronic pain syndrome 10/07/2014  . Carpal tunnel syndrome 09/22/2014  . Plantar fasciitis, bilateral 09/22/2014  . Obstructive sleep apnea 09/22/2014  . Psoriasis 09/22/2014    Celyn Nilda Simmer PT, MPH 01/12/2015, 12:11 PM  [facility] [place], [place], [address] Phone: [phone]   Fax:  [phone]

## 2015-01-14 ENCOUNTER — Ambulatory Visit (INDEPENDENT_AMBULATORY_CARE_PROVIDER_SITE_OTHER): Payer: No Typology Code available for payment source | Admitting: Physical Therapy

## 2015-01-14 DIAGNOSIS — R293 Abnormal posture: Secondary | ICD-10-CM | POA: Diagnosis not present

## 2015-01-14 DIAGNOSIS — G44229 Chronic tension-type headache, not intractable: Secondary | ICD-10-CM | POA: Diagnosis not present

## 2015-01-14 DIAGNOSIS — M436 Torticollis: Secondary | ICD-10-CM | POA: Diagnosis not present

## 2015-01-14 DIAGNOSIS — Z7409 Other reduced mobility: Secondary | ICD-10-CM | POA: Diagnosis not present

## 2015-01-14 NOTE — Patient Instructions (Signed)
Scapular Retraction: Abduction / Extension (Prone)   Lie with arms out from sides 90, thumbs pointing up toward the ceiling. Pinch shoulder blades together and raise arms a few inches from floor. Repeat 10 times per set. Do 1-3 sets per session. Do 1-2 sessions per day.   Scapular Retraction: Abduction (Prone)   Lie with upper arms straight out from sides, elbows bent to 90. Pinch shoulder blades together and raise arms a few inches from floor. Repeat __10__ times per set. Do ____ sets per session. Do _1-2___ sessions per day. 1-3 Pectoralis Stretch With Scapula Adduction: Supine (Towel Roll)   Lie with rolled towel under spine vertically. Gently slide arms up (like snow angel) to stretch chest.  Do _10__ times slowly, _1__ times per day.  [facility] at [facility] [address] [place], [place] [address]  [phone] (office) [phone] (fax)

## 2015-01-14 NOTE — Therapy (Signed)
[place], [place], [address] Phone: [phone]   Fax:  [phone]  Physical Therapy Treatment  Patient Details  Name: Desiree Hughes MRN: [id number] Date of Birth: March 23, 1956 Referring Provider:  Hali Marry, *  Encounter Date: 01/14/2015      PT End of Session - 01/14/15 1014    Visit Number 5   Number of Visits 12   Date for PT Re-Evaluation 02/11/15   PT Start Time 0933   PT Stop Time 1028   PT Time Calculation (min) 55 min   Activity Tolerance Patient tolerated treatment well      Past Medical History  Diagnosis Date  . Hypertension   . Herniated disc, cervical     Past Surgical History  Procedure Laterality Date  . Tubal ligation      There were no vitals filed for this visit.  Visit Diagnosis:  Chronic tension-type headache, not intractable  Stiff neck  Abnormal posture  Decreased mobility and endurance      Subjective Assessment - 01/14/15 0938    Subjective Doing ok. Neck and head pain are just there enough to let me know they're there. She believes allergies are playing a role in this.    Currently in Pain? Yes   Pain Score 1    Pain Location Head   Pain Orientation Right;Posterior   Pain Descriptors / Indicators Aching   Aggravating Factors  housework, yardwork   Pain Relieving Factors MHP.             [facility] PT Assessment - 01/14/15 0001    Assessment   Medical Diagnosis Cervical strain   Onset Date/Surgical Date 11/07/14   Hand Dominance Right   Next MD Visit 6 months   Prior Therapy none           [facility] Adult PT Treatment/Exercise - 01/14/15 0001    Neck Exercises: Prone   W Back 10 reps   Shoulder Extension 10 reps   Shoulder Exercises: Supine   Other Supine Exercises Hooklying on full foam roll: shoulder abd x 1 min for stretch, snow angels x 5 slow reps.   bilateral shoulder flexion with red band x 10, horizontal abd with red band  x 10    Shoulder Exercises: ROM/Strengthening   UBE (Upper Arm Bike) L2: 1.5 min each direction (standing)   Moist Heat Therapy   Number Minutes Moist Heat 15 Minutes   Moist Heat Location Cervical   Electrical Stimulation   Electrical Stimulation Location cervical upper trap bilat   Electrical Stimulation Action IFC   Electrical Stimulation Parameters to tolerance    Electrical Stimulation Goals Pain   Neck Exercises: Stretches   Neck Stretch 2 reps;10 seconds  lateral neck flexion    Chest Stretch 3 reps;30 seconds  doorway, tactile cues to correct form                PT Education - 01/14/15 1015    Education provided Yes   Education Details HEP- added 2 prone exercises    Person(s) Educated Patient   Methods Handout;Demonstration;Explanation   Comprehension Verbalized understanding;Returned demonstration             PT Long Term Goals - 01/12/15 1208    PT LONG TERM GOAL #1   Title Patient I in HEP for dischage 02/11/15   Period Weeks   Status On-going   PT LONG TERM GOAL #2   Title Cervical ROM WFL's  and pain free 02/11/15   Baseline good gains in cervical mobility noted - see ROM measurements   Time 6   Period Weeks   Status On-going   PT LONG TERM GOAL #3   Title improve posture and alignment with good cervical and thoracic alignment 02/11/15   Time 6   Period Weeks   Status Partially Met   PT LONG TERM GOAL #4   Title decrease frequency, intensity, duration of headaches 02/11/15   Baseline decreased c/o headaches and headaches she has are more "mild"   Time 6   Period Weeks   Status On-going   PT LONG TERM GOAL #5   Title Decrease FOTO to </=41% limitation 02/11/15   Time 6   Period Weeks   Status On-going               Plan - 01/14/15 1023    Clinical Impression Statement Pt tolerated new exercises well, without production of pain - just muscular fatigue. Pt reported 0/10 pain at conclusion of treatment.   Pt reported her  insurance will renew Oct 1 and will need to d/c to HEP by then.  Making good gains towards goals.    Pt will benefit from skilled therapeutic intervention in order to improve on the following deficits Decreased range of motion;Decreased mobility;Decreased activity tolerance;Decreased endurance;Pain   Rehab Potential Good   PT Frequency 2x / week   PT Duration 6 weeks   PT Treatment/Interventions Patient/family education;ADLs/Self Care Home Management;Therapeutic exercise;Therapeutic activities;Manual techniques;Cryotherapy;Electrical Stimulation;Moist Heat;Ultrasound;Traction;Dry needling   PT Next Visit Plan continue education re-spine care; postural correction; manual therapy to address soft tissue tightness; PROM/stretching for cervical musculature all planes.   Consulted and Agree with Plan of Care Patient        Problem List Patient Active Problem List   Diagnosis Date Noted  . GAD (generalized anxiety disorder) 12/29/2014  . GERD (gastroesophageal reflux disease) 10/07/2014  . Chronic pain syndrome 10/07/2014  . Carpal tunnel syndrome 09/22/2014  . Plantar fasciitis, bilateral 09/22/2014  . Obstructive sleep apnea 09/22/2014  . Psoriasis 09/22/2014    Kerin Perna, PTA 01/14/2015 10:40 AM  [place], [place], [address] Phone: [phone]   Fax:  [phone]

## 2015-01-19 ENCOUNTER — Ambulatory Visit (INDEPENDENT_AMBULATORY_CARE_PROVIDER_SITE_OTHER): Payer: PRIVATE HEALTH INSURANCE | Admitting: Physical Therapy

## 2015-01-19 DIAGNOSIS — M436 Torticollis: Secondary | ICD-10-CM

## 2015-01-19 DIAGNOSIS — R293 Abnormal posture: Secondary | ICD-10-CM | POA: Diagnosis not present

## 2015-01-19 DIAGNOSIS — Z7409 Other reduced mobility: Secondary | ICD-10-CM | POA: Diagnosis not present

## 2015-01-19 NOTE — Therapy (Signed)
[facility] [place] [address] [place], [place], [address] Phone: [phone]   Fax:  [phone]  Physical Therapy Treatment  Patient Details  Name: Desiree Hughes MRN: [id number] Date of Birth: 13-Apr-1956 Referring Carlyann Placide:  Agapito Games, *  Encounter Date: 01/19/2015      PT End of Session - 01/19/15 1007    Visit Number 6   Number of Visits 12   Date for PT Re-Evaluation 02/11/15   PT Start Time 1002   PT Stop Time 1059   PT Time Calculation (min) 57 min   Activity Tolerance Patient tolerated treatment well      Past Medical History  Diagnosis Date  . Hypertension   . Herniated disc, cervical     Past Surgical History  Procedure Laterality Date  . Tubal ligation      There were no vitals filed for this visit.  Visit Diagnosis:  Abnormal posture  Stiff neck  Decreased mobility and endurance          [facility] PT Assessment - 01/19/15 0001    Assessment   Medical Diagnosis Cervical strain   Onset Date/Surgical Date 11/07/14   Hand Dominance Right   Next MD Visit 6 months   Prior Therapy none   AROM   Cervical Flexion 45   Cervical Extension 55   Cervical - Right Side Bend 35   Cervical - Left Side Bend 40   Cervical - Right Rotation 67   Cervical - Left Rotation 60           [facility] Adult PT Treatment/Exercise - 01/19/15 0001    Neck Exercises: Prone   W Back 10 reps   Shoulder Extension 10 reps   Other Prone Exercise Quadruped:  arm extension x 5 each arm, Opp arm and leg x 5 reps each side.    Other Prone Exercise child's pose x 20 sec, with lateral trunk flexion x 20 sec each side   Shoulder Exercises: Supine   Protraction Both;10 reps;Weights   Protraction Weight (lbs) 2#   Flexion Right;Left;5 reps;Weights   Shoulder Flexion Weight (lbs) 2#   Other Supine Exercises Hooklying on full foam roll: shoulder abd x 1 min for stretch, snow angels x 10; y's with 2# wt x 10 reps    Shoulder Exercises: Standing   External Rotation Both;10 reps;Theraband   Theraband Level (Shoulder External Rotation) Level 3 (Green)   Extension Strengthening;Right;Left;10 reps;Theraband   Theraband Level (Shoulder Extension) Level 3 (Green)   Row Strengthening;Right;Left;10 reps;Theraband   Theraband Level (Shoulder Row) Level 3 (Green)   Manual Therapy   Manual therapy comments Manual therapy with edge tool instrument assist to decrease pain/ increase tissue extensibility: performed in seated position (head supported) to Rt/Lt cervical paraspinals and upper traps    Neck Exercises: Stretches   Neck Stretch 2 reps;10 seconds  lateral neck flexion    Chest Stretch 3 reps;30 seconds  doorway, tactile cues to correct form                     PT Long Term Goals - 01/19/15 1055    PT LONG TERM GOAL #1   Title Patient I in HEP for discharge 02/11/15   Time 6   Period Weeks   Status On-going   PT LONG TERM GOAL #2   Title Cervical ROM WFL's and pain free 02/11/15   Time 6   Period Weeks   Status Partially Met  PT LONG TERM GOAL #3   Title improve posture and alignment with good cervical and thoracic alignment 02/11/15   Time 6   Period Weeks   Status Achieved   PT LONG TERM GOAL #4   Title decrease frequency, intensity, duration of headaches 02/11/15   Time 6   Period Weeks   Status Achieved   PT LONG TERM GOAL #5   Title Decrease FOTO to </=41% limitation 02/11/15   Time 6   Period Weeks   Status On-going               Plan - 01/19/15 1314    Clinical Impression Statement Pt tolerated all new exercises without production of pain.  Pt demo improved cervical ROM this visit, and reduction of stiffness/further increase in ROM after manual therapy.  Pt has met LTG # 3 +4.    Pt will benefit from skilled therapeutic intervention in order to improve on the following deficits Decreased range of motion;Decreased mobility;Decreased activity tolerance;Decreased  endurance;Pain   Rehab Potential Good   PT Frequency 2x / week   PT Duration 6 weeks   PT Treatment/Interventions Patient/family education;ADLs/Self Care Home Management;Therapeutic exercise;Therapeutic activities;Manual techniques;Cryotherapy;Electrical Stimulation;Moist Heat;Ultrasound;Traction;Dry needling   PT Next Visit Plan Progress HEP and assess readiness for d/c to HEP.    Consulted and Agree with Plan of Care Patient        Problem List Patient Active Problem List   Diagnosis Date Noted  . GAD (generalized anxiety disorder) 12/29/2014  . GERD (gastroesophageal reflux disease) 10/07/2014  . Chronic pain syndrome 10/07/2014  . Carpal tunnel syndrome 09/22/2014  . Plantar fasciitis, bilateral 09/22/2014  . Obstructive sleep apnea 09/22/2014  . Psoriasis 09/22/2014    Kerin Perna, PTA 01/19/2015 1:18 PM  [facility] [place], [place], [address] Phone: [phone]   Fax:  [phone]

## 2015-01-21 ENCOUNTER — Ambulatory Visit (INDEPENDENT_AMBULATORY_CARE_PROVIDER_SITE_OTHER): Payer: PRIVATE HEALTH INSURANCE | Admitting: Rehabilitative and Restorative Service Providers"

## 2015-01-21 DIAGNOSIS — Z7409 Other reduced mobility: Secondary | ICD-10-CM

## 2015-01-21 DIAGNOSIS — R293 Abnormal posture: Secondary | ICD-10-CM | POA: Diagnosis not present

## 2015-01-21 DIAGNOSIS — M436 Torticollis: Secondary | ICD-10-CM | POA: Diagnosis not present

## 2015-01-21 DIAGNOSIS — G44229 Chronic tension-type headache, not intractable: Secondary | ICD-10-CM

## 2015-01-21 NOTE — Telephone Encounter (Signed)
Patient agreed to try Lunesta first.

## 2015-01-21 NOTE — Therapy (Signed)
[facility] [place] [address] [place], [place], [address] Phone: [phone]   Fax:  [phone]  Physical Therapy Treatment  Patient Details  Name: Desiree Hughes MRN: [id number] Date of Birth: 03-24-1956 Referring Provider:  Hali Marry, *  Encounter Date: 01/21/2015      PT End of Session - 01/21/15 1015    Visit Number 7   Number of Visits 12   Date for PT Re-Evaluation 02/11/15   PT Start Time 0762   PT Stop Time 1116   PT Time Calculation (min) 61 min   Activity Tolerance Patient tolerated treatment well      Past Medical History  Diagnosis Date  . Hypertension   . Herniated disc, cervical     Past Surgical History  Procedure Laterality Date  . Tubal ligation      There were no vitals filed for this visit.  Visit Diagnosis:  Stiff neck  Abnormal posture  Decreased mobility and endurance  Chronic tension-type headache, not intractable      Subjective Assessment - 01/21/15 1016    Subjective Patient reports that she did okay with the new manual techniques. She has some stiffness at the base of her neck but thinks that may be due to new pillows. Insurance changes 01/23/15 and pt would like to hold therapy for now and see how shes does.    Currently in Pain? Yes   Pain Score 1    Pain Location Head   Pain Orientation Right;Posterior   Pain Descriptors / Indicators Aching            [facility] PT Assessment - 01/21/15 0001    Assessment   Medical Diagnosis Cervical strain   Onset Date/Surgical Date 11/07/14   Hand Dominance Right   Next MD Visit 6 months   Prior Therapy none   Observation/Other Assessments   Focus on Therapeutic Outcomes (FOTO)  35% limitation    AROM   Cervical Flexion 45   Cervical Extension 55   Cervical - Right Side Bend 35   Cervical - Left Side Bend 40   Cervical - Right Rotation 67   Cervical - Left Rotation 60                     [facility] Adult PT  Treatment/Exercise - 01/21/15 0001    Shoulder Exercises: Standing   External Rotation Both;10 reps;Theraband   Theraband Level (Shoulder External Rotation) Level 3 (Green)   Extension Strengthening;Right;Left;10 reps;Theraband   Theraband Level (Shoulder Extension) Level 3 (Green)   Row Strengthening;Right;Left;10 reps;Theraband   Theraband Level (Shoulder Row) Level 3 (Green)   Other Standing Exercises scap squeeze 10 sec x 10 with noodle   Shoulder Exercises: ROM/Strengthening   UBE (Upper Arm Bike) L2: 1.5 min each direction (standing)   Shoulder Exercises: Stretch   Corner Stretch Limitations doorway 3 positions 30 sec hold 2 reps each   Other Shoulder Stretches scap squeeze with noodle 10 sec hold 10 reps   Moist Heat Therapy   Number Minutes Moist Heat 15 Minutes   Moist Heat Location Cervical   Manual Therapy   Manual Therapy Myofascial release   Manual therapy comments Manual therapy with edge tool instrument assist to decrease pain/ increase tissue extensibility: performed in seated position (head supported) to Rt/Lt cervical paraspinals and upper traps    Soft tissue mobilization bilat ant/lat/post cervical musculature; upper traps    Myofascial Release chest/cervical    Passive ROM passive  stretch c-spine into flexion/lateral flexion/scalenes  3 reps 30 sec hold    Manual Traction cervical traction 30-45 sec hold 2-3 reps   Neck Exercises: Stretches   Neck Stretch 2 reps;10 seconds  lateral neck flexion    Chest Stretch 3 reps;30 seconds  doorway, tactile cues to correct form                PT Education - 01/21/15 1124    Education provided Yes   Education Details importance of posture and alignment; HEP; instructed in application, use and care of TENS unit   Person(s) Educated Patient   Methods Explanation;Demonstration;Tactile cues;Verbal cues   Comprehension Verbalized understanding;Returned demonstration;Verbal cues required;Tactile cues required              PT Long Term Goals - 01/21/15 1128    PT LONG TERM GOAL #1   Title Patient I in HEP for discharge 02/11/15   Time 6   Period Weeks   Status Achieved   PT LONG TERM GOAL #2   Title Cervical ROM WFL's and pain free 02/11/15   Baseline good gains in cervical mobility noted - see ROM measurements   Time 6   Period Weeks   Status Achieved   PT LONG TERM GOAL #3   Title improve posture and alignment with good cervical and thoracic alignment 02/11/15   Time 6   Period Weeks   Status Achieved   PT LONG TERM GOAL #4   Baseline decreased c/o headaches and headaches she has are more "mild"   Time 6   Period Weeks   Status Achieved   PT LONG TERM GOAL #5   Title Decrease FOTO to </=41% limitation 02/11/15   Time 6   Period Weeks   Status Achieved               Plan - 01/21/15 1125    Clinical Impression Statement Patient reports good progress with PT. She is pleased with her progress. She deomnstrates improved cervical mobility; decreased muscular tightness to palpation and improved posture and alignment. Goals of therapy have been partially met. Patient requests D/C to I HEP.    Pt will benefit from skilled therapeutic intervention in order to improve on the following deficits Decreased range of motion;Decreased mobility;Decreased activity tolerance;Decreased endurance;Pain   Rehab Potential Good   PT Frequency 2x / week   PT Duration 6 weeks   PT Treatment/Interventions Patient/family education;ADLs/Self Care Home Management;Therapeutic exercise;Therapeutic activities;Manual techniques;Cryotherapy;Electrical Stimulation;Moist Heat;Ultrasound;Traction;Dry needling   PT Next Visit Plan D/C to I HEP patient will call with any concerns or questions   PT Home Exercise Plan postural correction; HEP; home TENS unit prn   Consulted and Agree with Plan of Care Patient        Problem List Patient Active Problem List   Diagnosis Date Noted  . GAD (generalized  anxiety disorder) 12/29/2014  . GERD (gastroesophageal reflux disease) 10/07/2014  . Chronic pain syndrome 10/07/2014  . Carpal tunnel syndrome 09/22/2014  . Plantar fasciitis, bilateral 09/22/2014  . Obstructive sleep apnea 09/22/2014  . Psoriasis 09/22/2014    Celyn Nilda Simmer PT, MPH 01/21/2015, 11:30 AM  [facility] [place] [facility] [place], [place], [address] Phone: [phone]   Fax:  [phone]     PHYSICAL THERAPY DISCHARGE SUMMARY  Visits from Start of Care: 7  Current functional level related to goals / functional outcomes: Good progress with PT and HEP    Remaining deficits: Headaches  continue however less frequently and less intense   Education / Equipment: HEP  Plan: Patient agrees to discharge.  Patient goals were partially met. Patient is being discharged due to the patient's request.  ?????     Celyn P. Helene Kelp PT, MPH 01/21/2015 11:48 AM

## 2015-01-22 MED ORDER — ESZOPICLONE 2 MG PO TABS: 2.0000 mg | ORAL_TABLET | Freq: Every evening | ORAL | Status: DC | PRN

## 2015-01-22 NOTE — Telephone Encounter (Signed)
Left message advising rx has been sent in.

## 2015-01-22 NOTE — Telephone Encounter (Signed)
New rx sent

## 2015-02-03 ENCOUNTER — Telehealth: Payer: Self-pay | Admitting: Family Medicine

## 2015-02-03 NOTE — Telephone Encounter (Signed)
Received fax for prior authorization on Belsomra sent through cover my meds waiting on authorization. - CF

## 2015-02-15 ENCOUNTER — Other Ambulatory Visit: Payer: Self-pay | Admitting: *Deleted

## 2015-02-15 NOTE — Telephone Encounter (Signed)
error 

## 2015-03-22 ENCOUNTER — Other Ambulatory Visit: Payer: Self-pay | Admitting: Family Medicine

## 2015-06-15 NOTE — Telephone Encounter (Signed)
Received fax from [facility] health and they denied coverage on Belsomra due to they want her to consider a trial of non-pharmacologic therapy such as psychological or behavioral interventions, and eszopiclone as an effective alternative. - CF

## 2015-06-16 ENCOUNTER — Telehealth: Payer: Self-pay | Admitting: Family Medicine

## 2015-06-16 NOTE — Telephone Encounter (Signed)
Call pt: is she wanting to try Belsomra again? Or is she doing ok with Lunesta?

## 2015-06-22 MED ORDER — TRAZODONE HCL 50 MG PO TABS: 25.0000 mg | ORAL_TABLET | Freq: Every evening | ORAL | Status: DC | PRN

## 2015-06-22 NOTE — Telephone Encounter (Signed)
Ok, let do the trazodone. She can taper and adjust her dose as tolerated. Teh script will have a range on it.

## 2015-06-22 NOTE — Telephone Encounter (Signed)
Pt informed.Desiree Hughes  

## 2015-06-22 NOTE — Telephone Encounter (Signed)
Called pt to find out which medication is working. She stated that the Lunesta doesn't work and the [employer] is not covered. She stated that in the past she has tried xanax which has worked especially when she is stressed, and she has tried trazodone .Will fwd to pcp for advice.Loralee Pacas [employer]

## 2015-12-24 ENCOUNTER — Encounter: Payer: Self-pay | Admitting: Emergency Medicine

## 2015-12-24 ENCOUNTER — Emergency Department (INDEPENDENT_AMBULATORY_CARE_PROVIDER_SITE_OTHER): Payer: PRIVATE HEALTH INSURANCE

## 2015-12-24 ENCOUNTER — Emergency Department (INDEPENDENT_AMBULATORY_CARE_PROVIDER_SITE_OTHER)
Admission: EM | Admit: 2015-12-24 | Discharge: 2015-12-24 | Disposition: A | Discharge status: Home / Self Care | Payer: PRIVATE HEALTH INSURANCE | Source: Home / Self Care | Attending: Family Medicine | Admitting: Family Medicine

## 2015-12-24 DIAGNOSIS — S139XXA Sprain of joints and ligaments of unspecified parts of neck, initial encounter: Secondary | ICD-10-CM

## 2015-12-24 DIAGNOSIS — M5030 Other cervical disc degeneration, unspecified cervical region: Secondary | ICD-10-CM

## 2015-12-24 DIAGNOSIS — S134XXA Sprain of ligaments of cervical spine, initial encounter: Secondary | ICD-10-CM | POA: Diagnosis not present

## 2015-12-24 MED ORDER — MELOXICAM 15 MG PO TABS: 15.0000 mg | ORAL_TABLET | Freq: Every day | ORAL | 0 refills | Status: DC

## 2015-12-24 MED ORDER — KETOROLAC TROMETHAMINE 60 MG/2ML IJ SOLN
60.0000 mg | Freq: Once | INTRAMUSCULAR | Status: AC
  Administered 2015-12-24: 60 mg via INTRAMUSCULAR
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
## 2015-12-24 NOTE — Discharge Instructions (Signed)
Apply ice pack for 20 to 30 minutes, 3 to 4 times daily  Continue until pain decreases.  °Begin range of motion and stretching exercises as tolerated. °
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
## 2015-12-25 ENCOUNTER — Other Ambulatory Visit: Payer: Self-pay | Admitting: Family Medicine

## 2016-01-04 ENCOUNTER — Encounter: Payer: Self-pay | Admitting: Family Medicine

## 2016-01-04 ENCOUNTER — Ambulatory Visit (INDEPENDENT_AMBULATORY_CARE_PROVIDER_SITE_OTHER): Payer: PRIVATE HEALTH INSURANCE | Admitting: Family Medicine

## 2016-01-04 VITALS — BP 130/75 | HR 82 | Wt 152.0 lb

## 2016-01-04 DIAGNOSIS — R51 Headache: Secondary | ICD-10-CM

## 2016-01-04 DIAGNOSIS — M545 Low back pain, unspecified: Secondary | ICD-10-CM

## 2016-01-04 DIAGNOSIS — Z23 Encounter for immunization: Secondary | ICD-10-CM | POA: Diagnosis not present

## 2016-01-04 DIAGNOSIS — S46819A Strain of other muscles, fascia and tendons at shoulder and upper arm level, unspecified arm, initial encounter: Secondary | ICD-10-CM

## 2016-01-04 DIAGNOSIS — S161XXA Strain of muscle, fascia and tendon at neck level, initial encounter: Secondary | ICD-10-CM | POA: Diagnosis not present

## 2016-01-04 DIAGNOSIS — R519 Headache, unspecified: Secondary | ICD-10-CM

## 2016-01-04 MED ORDER — CYCLOBENZAPRINE HCL 10 MG PO TABS: 5.0000 mg | ORAL_TABLET | Freq: Every evening | ORAL | 0 refills | Status: DC | PRN
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
## 2016-01-12 ENCOUNTER — Ambulatory Visit (INDEPENDENT_AMBULATORY_CARE_PROVIDER_SITE_OTHER): Payer: PRIVATE HEALTH INSURANCE | Admitting: Rehabilitative and Restorative Service Providers"

## 2016-01-12 DIAGNOSIS — M542 Cervicalgia: Secondary | ICD-10-CM | POA: Diagnosis not present

## 2016-01-12 DIAGNOSIS — R293 Abnormal posture: Secondary | ICD-10-CM

## 2016-01-12 DIAGNOSIS — G4452 New daily persistent headache (NDPH): Secondary | ICD-10-CM | POA: Diagnosis not present

## 2016-01-12 DIAGNOSIS — G44211 Episodic tension-type headache, intractable: Secondary | ICD-10-CM

## 2016-01-12 DIAGNOSIS — M545 Low back pain, unspecified: Secondary | ICD-10-CM

## 2016-01-12 DIAGNOSIS — R29898 Other symptoms and signs involving the musculoskeletal system: Secondary | ICD-10-CM | POA: Diagnosis not present
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
## 2016-01-12 NOTE — Patient Instructions (Signed)
Abdominal Bracing With Pelvic Floor (Hook-Lying)    With neutral spine, tighten pelvic floor and abdominals sucking belly button to back bone; tighten muscles in the low back at waist Hold 10 sec . Repeat __10_ times. Do _several__ times a day. Progress to do this in sitting; standing; walking and with functional activities  Axial Extension (Chin Tuck)    Pull chin in and lengthen back of neck. Hold 10____ seconds while counting out loud. Repeat __5-10__ times. Do __several __ sessions per day.  Sleeping on Back  Place pillow under knees. A pillow with cervical support and a roll around waist are also helpful. Copyright  VHI. All rights reserved.  Sleeping on Side Place pillow between knees. Use cervical support under neck and a roll around waist as needed. Copyright  VHI. All rights reserved.   Sleeping on Stomach   If this is the only desirable sleeping position, place pillow under lower legs, and under stomach or chest as needed.  Posture - Sitting   Sit upright, head facing forward. Try using a roll to support lower back. Keep shoulders relaxed, and avoid rounded back. Keep hips level with knees. Avoid crossing legs for long periods. Stand to Sit / Sit to Stand   To sit: Bend knees to lower self onto front edge of chair, then scoot back on seat. To stand: Reverse sequence by placing one foot forward, and scoot to front of seat. Use rocking motion to stand up.   Work Height and Reach  Ideal work height is no more than 2 to 4 inches below elbow level when standing, and at elbow level when sitting. Reaching should be limited to arm's length, with elbows slightly bent.  Bending  Bend at hips and knees, not back. Keep feet shoulder-width apart.    Posture - Standing   Good posture is important. Avoid slouching and forward head thrust. Maintain curve in low back and align ears over shoul- ders, hips over ankles.  Alternating Positions   Alternate tasks and change  positions frequently to reduce fatigue and muscle tension. Take rest breaks. Computer Work   Position work to Art gallery managerface forward. Use proper work and seat height. Keep shoulders back and down, wrists straight, and elbows at right angles. Use chair that provides full back support. Add footrest and lumbar roll as needed.  Getting Into / Out of Car  Lower self onto seat, scoot back, then bring in one leg at a time. Reverse sequence to get out.  Dressing  Lie on back to pull socks or slacks over feet, or sit and bend leg while keeping back straight.    Housework - Sink  Place one foot on ledge of cabinet under sink when standing at sink for prolonged periods.   Pushing / Pulling  Pushing is preferable to pulling. Keep back in proper alignment, and use leg muscles to do the work.  Deep Squat   Squat and lift with both arms held against upper trunk. Tighten stomach muscles without holding breath. Use smooth movements to avoid jerking.  Avoid Twisting   Avoid twisting or bending back. Pivot around using foot movements, and bend at knees if needed when reaching for articles.  Carrying Luggage   Distribute weight evenly on both sides. Use a cart whenever possible. Do not twist trunk. Move body as a unit.   Lifting Principles .Maintain proper posture and head alignment. .Slide object as close as possible before lifting. .Move obstacles out of the way. .Test before lifting;  ask for help if too heavy. .Tighten stomach muscles without holding breath. .Use smooth movements; do not jerk. .Use legs to do the work, and pivot with feet. .Distribute the work load symmetrically and close to the center of trunk. .Push instead of pull whenever possible.   Ask For Help   Ask for help and delegate to others when possible. Coordinate your movements when lifting together, and maintain the low back curve.  Log Roll   Lying on back, bend left knee and place left arm across chest. Roll all in  one movement to the right. Reverse to roll to the left. Always move as one unit. Housework - Sweeping  Use long-handled equipment to avoid stooping.   Housework - Wiping  Position yourself as close as possible to reach work surface. Avoid straining your back.  Laundry - Unloading Wash   To unload small items at bottom of washer, lift leg opposite to arm being used to reach.  Gardening - Raking  Move close to area to be raked. Use arm movements to do the work. Keep back straight and avoid twisting.     Cart  When reaching into cart with one arm, lift opposite leg to keep back straight.   Getting Into / Out of Bed  Lower self to lie down on one side by raising legs and lowering head at the same time. Use arms to assist moving without twisting. Bend both knees to roll onto back if desired. To sit up, start from lying on side, and use same move-ments in reverse. Housework - Vacuuming  Hold the vacuum with arm held at side. Step back and forth to move it, keeping head up. Avoid twisting.   Laundry - Armed forces training and education officer so that bending and twisting can be avoided.   Laundry - Unloading Dryer  Squat down to reach into clothes dryer or use a reacher.  Gardening - Weeding / Psychiatric nurse or Kneel. Knee pads may be helpful.

## 2016-01-17 ENCOUNTER — Ambulatory Visit (INDEPENDENT_AMBULATORY_CARE_PROVIDER_SITE_OTHER): Payer: PRIVATE HEALTH INSURANCE | Admitting: Rehabilitative and Restorative Service Providers"

## 2016-01-17 ENCOUNTER — Encounter: Payer: Self-pay | Admitting: Rehabilitative and Restorative Service Providers"

## 2016-01-17 DIAGNOSIS — R29898 Other symptoms and signs involving the musculoskeletal system: Secondary | ICD-10-CM | POA: Diagnosis not present

## 2016-01-17 DIAGNOSIS — M545 Low back pain, unspecified: Secondary | ICD-10-CM

## 2016-01-17 DIAGNOSIS — G44211 Episodic tension-type headache, intractable: Secondary | ICD-10-CM

## 2016-01-17 DIAGNOSIS — R293 Abnormal posture: Secondary | ICD-10-CM | POA: Diagnosis not present

## 2016-01-17 DIAGNOSIS — M542 Cervicalgia: Secondary | ICD-10-CM

## 2016-01-17 NOTE — Therapy (Signed)
[facility] [address] [place], [place], [address] Phone: [phone]   Fax:  [phone]  Physical Therapy Treatment  Patient Details  Name: Desiree Hughes MRN: [id number] Date of Birth: 27-Dec-1955 Referring Provider: Dr Nani Gasser  Encounter Date: 01/17/2016      PT End of Session - 01/17/16 0846    Visit Number 2   Number of Visits 12   Date for PT Re-Evaluation 02/23/16   PT Start Time 0846   PT Stop Time 0939   PT Time Calculation (min) 53 min   Activity Tolerance Patient tolerated treatment well      Past Medical History:  Diagnosis Date  . Herniated disc, cervical   . Hypertension     Past Surgical History:  Procedure Laterality Date  . TUBAL LIGATION      There were no vitals filed for this visit.      Subjective Assessment - 01/17/16 0847    Subjective Still stiff and tight - pain persists. LBP with bending; twisting; reaching the wrong way. Headache persists. neck is tight. Doing exercises and working on posture and trying not to let the puppy pull on her too much.    Currently in Pain? Yes   Pain Score 6    Pain Location Head   Pain Orientation Posterior;Right   Pain Descriptors / Indicators Tingling;Throbbing   Pain Type Acute pain   Pain Onset 1 to 4 weeks ago   Pain Frequency Intermittent   Pain Score 6   Pain Location Neck   Pain Orientation Right;Left   Pain Descriptors / Indicators Aching;Tightness   Pain Type Acute pain   Pain Onset 1 to 4 weeks ago   Pain Frequency Intermittent   Pain Score --  does not hurt all the time - mostly with movements    Pain Location Back   Pain Orientation Lower;Right   Pain Descriptors / Indicators Sharp;Stabbing   Pain Type Acute pain   Pain Onset 1 to 4 weeks ago   Pain Frequency Intermittent                         [facility] Adult PT Treatment/Exercise - 01/17/16 0001      Neck Exercises: Standing   Thumb Tacks  scapsqueeze with noodle 10 sec x 10      Neck Exercises: Seated   Neck Retraction 5 reps     Neck Exercises: Supine   Neck Retraction 10 reps;10 secs     Lumbar Exercises: Supine   Ab Set --  3 part core 10 sec x 10      Moist Heat Therapy   Number Minutes Moist Heat 20 Minutes   Moist Heat Location Cervical;Lumbar Spine  thoracic/trap bilat      Electrical Stimulation   Electrical Stimulation Location bilat cervical paraspinals; upper traps   Electrical Stimulation Action IFC   Electrical Stimulation Parameters to tolerance   Electrical Stimulation Goals Pain;Tone  tightness      Manual Therapy   Manual therapy comments pt supine    Joint Mobilization CPA and UPA Grade III mobs cervical spine    Soft tissue mobilization ant/lat/post cervical; upper traps; leveator; pecs bilat    Myofascial Release anterior cervical/chest   Passive ROM cervical flexion; cervical flexion with slight rotation; lateral flexion bilat    Manual Traction cervical through treatment several reps mod pull 20-30 sec hold      Neck Exercises: Stretches  Upper Trapezius Stretch 3 reps;20 seconds   Other Neck Stretches 3 way doorway stretch 30 sec x 3 each position                 PT Education - 01/17/16 0902    Education provided Yes   Education Details HEP postural correction    Person(s) Educated Patient   Methods Explanation;Demonstration;Tactile cues;Verbal cues;Handout   Comprehension Verbalized understanding;Returned demonstration;Verbal cues required;Tactile cues required             PT Long Term Goals - 01/17/16 0846      PT LONG TERM GOAL #1   Title Patient to demonstrate upright posture with good alignment of head on body 02/23/16   Time 6   Period Weeks   Status On-going     PT LONG TERM GOAL #2   Title Cervical and lumbar ROM WFL's and pain free 02/23/16   Time 6   Period Weeks   Status On-going     PT LONG TERM GOAL #3   Title Patient reports return to normal  functional activities with minimal to no pain or discomfort 02/23/16   Time 6   Period Weeks   Status On-going     PT LONG TERM GOAL #4   Title decrease frequency, intensity, duration of headaches 02/23/16   Time 6   Period Weeks   Status On-going     PT LONG TERM GOAL #5   Title Patient independent in HEP 02/23/16   Time 6   Period Weeks   Status On-going     PT LONG TERM GOAL #6   Title Improve FOTO to </= 43% limitation 02/23/16   Time 6   Period Weeks   Status On-going               Plan - 01/17/16 0853    Clinical Impression Statement Patient is working on HEP pretty much - working on posture and trying to watch movements. Good response to manual work and modalities. Note increase in prominence at Lt sternoclavicular joint - pt reports more irritaioin aat thjis area today but the "lump" has been there for a long time(years). No goals accomplished. Only second visit.    Rehab Potential Good   PT Frequency 2x / week   PT Duration 6 weeks   PT Treatment/Interventions Patient/family education;ADLs/Self Care Home Management;Neuromuscular re-education;Cryotherapy;Electrical Stimulation;Iontophoresis 4mg /ml Dexamethasone;Moist Heat;Traction;Ultrasound;Dry needling;Manual techniques;Therapeutic activities;Therapeutic exercise   PT Next Visit Plan continue with postural correction. stretching for cervical and LE tightness; core stabilization; consider taping lumbar spine; manual work through the cervical and occipital area(lumbar as time allows); modalities as indicated.    Consulted and Agree with Plan of Care Patient      Patient will benefit from skilled therapeutic intervention in order to improve the following deficits and impairments:  Postural dysfunction, Improper body mechanics, Pain, Decreased range of motion, Decreased mobility, Increased fascial restricitons, Increased muscle spasms, Decreased activity tolerance  Visit Diagnosis: Cervicalgia  Right-sided low back  pain without sciatica  Other symptoms and signs involving the musculoskeletal system  Abnormal posture  Intractable episodic tension-type headache     Problem List Patient Active Problem List   Diagnosis Date Noted  . GAD (generalized anxiety disorder) 12/29/2014  . GERD (gastroesophageal reflux disease) 10/07/2014  . Chronic pain syndrome 10/07/2014  . Carpal tunnel syndrome 09/22/2014  . Plantar fasciitis, bilateral 09/22/2014  . Obstructive sleep apnea 09/22/2014  . Psoriasis 09/22/2014    Markiah Janeway Rober MinionP Joylyn Duggin PT, MPH  01/17/2016, 9:34 AM  [facility] [address] [place], [place], [address] Phone: [phone]   Fax:  [phone]  Name: Gustava Berland MRN: [id number] Date of Birth: 02/23/1956

## 2016-01-17 NOTE — Patient Instructions (Addendum)
Scapula Adduction With Pectoralis Stretch: Low - Standing   Shoulders at 45 hands even with shoulders, keeping weight through legs, shift weight forward until you feel pull or stretch through the front of your chest. Hold _30__ seconds. Do _3__ times, _2-4__ times per day.   Scapula Adduction With Pectoralis Stretch: Mid-Range - Standing   Shoulders at 90 elbows even with shoulders, keeping weight through legs, shift weight forward until you feel pull or strength through the front of your chest. Hold __30_ seconds. Do _3__ times, __2-4_ times per day.   Scapula Adduction With Pectoralis Stretch: High - Standing   Shoulders at 120 hands up high on the doorway, keeping weight on feet, shift weight forward until you feel pull or stretch through the front of your chest. Hold _30__ seconds. Do _3__ times, _2-3__ times per day.  Side Bend, Sitting    Sit, hand over top of head. Keep chin tucked! Gently pull head to one side. Hold _20__ seconds. Repeat _3-4__ times per session. Do 2-3___ sessions per day.  Shoulder Blade Squeeze    Rotate shoulders back, then squeeze shoulder blades down and back. Hold 10 sec Repeat __10__ times. Do __several__ sessions per day. Can use swim noodle for tactile cue.  http://gt2.exer.us/847   Copyright  VHI. All rights reserved.

## 2016-01-20 ENCOUNTER — Ambulatory Visit (INDEPENDENT_AMBULATORY_CARE_PROVIDER_SITE_OTHER): Payer: PRIVATE HEALTH INSURANCE | Admitting: Physical Therapy

## 2016-01-20 DIAGNOSIS — R29898 Other symptoms and signs involving the musculoskeletal system: Secondary | ICD-10-CM | POA: Diagnosis not present

## 2016-01-20 DIAGNOSIS — R293 Abnormal posture: Secondary | ICD-10-CM | POA: Diagnosis not present

## 2016-01-20 DIAGNOSIS — M545 Low back pain, unspecified: Secondary | ICD-10-CM

## 2016-01-20 DIAGNOSIS — M542 Cervicalgia: Secondary | ICD-10-CM

## 2016-01-20 DIAGNOSIS — G44211 Episodic tension-type headache, intractable: Secondary | ICD-10-CM

## 2016-01-20 NOTE — Therapy (Signed)
[facility] [address] [place] [address] [place], [place], [address] Phone: [phone]   Fax:  [phone]  Physical Therapy Treatment  Patient Details  Name: Desiree BushmanCindy Hughes MRN: [id number] Date of Birth: 02-24-1956 Referring Provider: Dr Desiree Hughes  Encounter Date: 01/20/2016      PT End of Session - 01/20/16 0849    Visit Number 3   Number of Visits 12   Date for PT Re-Evaluation 02/23/16   PT Start Time 0849   PT Stop Time 0944   PT Time Calculation (min) 55 min      Past Medical History:  Diagnosis Date  . Herniated disc, cervical   . Hypertension     Past Surgical History:  Procedure Laterality Date  . TUBAL LIGATION      There were no vitals filed for this visit.      Subjective Assessment - 01/20/16 0859    Subjective Stiff and tight.  Getting back in the routine of exericses has been challenging.  Her puppy is pulling on leash with walks which is irritating her neck.     Currently in Pain? Yes   Pain Score 6    Pain Location Head   Aggravating Factors  first thing in day,     Pain Relieving Factors ice, medication.              [facility] PT Assessment - 01/20/16 0001      Assessment   Medical Diagnosis Cervical strain; migraines; LBP   Onset Date/Surgical Date 12/24/15   Hand Dominance Right   Next MD Visit 10/17   Prior Therapy 1 year ago - for migraines     AROM   Cervical - Right Side Bend 32   Cervical - Left Side Bend 40   Cervical - Right Rotation 60   Cervical - Left Rotation 55          [facility] Adult PT Treatment/Exercise - 01/20/16 0001      Self-Care   Self-Care Other Self-Care Comments   Other Self-Care Comments  Pt educated on use of ball for self massage to upper and lower back muscles to tolerance; pt verbalized understanding     Exercises   Exercises Neck;Lumbar     Neck Exercises: Machines for Strengthening   UBE (Upper Arm Bike) L1: 4 min alternating     Neck  Exercises: Standing   Thumb Tacks scapsqueeze with noodle 10 sec x 10      Neck Exercises: Supine   Neck Retraction 10 reps;10 secs     Moist Heat Therapy   Number Minutes Moist Heat 15 Minutes   Moist Heat Location Lumbar Spine;Cervical     Electrical Stimulation   Electrical Stimulation Location bilat cervical and lumbar paraspinals.    Electrical Stimulation Action premod to each area   Electrical Stimulation Parameters to tolerance   Electrical Stimulation Goals Pain;Tone     Manual Therapy   Manual therapy comments pt supine.    Soft tissue mobilization ant/lat/post cervical; upper traps,  temporal muscles   Myofascial Release anterior cervical/chest, upper trap/ thoracic paraspinals.  suboccipital release   Passive ROM cervical flexion; cervical flexion with slight rotation; lateral flexion bilat      Neck Exercises: Stretches   Upper Trapezius Stretch 3 reps;20 seconds   Other Neck Stretches 3 way doorway stretch 30 sec x 3 each position    Other Neck Stretches cervical diagonals x 15 sec each direction x 2 reps  PT Long Term Goals - 01/17/16 0846      PT LONG TERM GOAL #1   Title Patient to demonstrate upright posture with good alignment of head on body 02/23/16   Time 6   Period Weeks   Status On-going     PT LONG TERM GOAL #2   Title Cervical and lumbar ROM WFL's and pain free 02/23/16   Time 6   Period Weeks   Status On-going     PT LONG TERM GOAL #3   Title Patient reports return to normal functional activities with minimal to no pain or discomfort 02/23/16   Time 6   Period Weeks   Status On-going     PT LONG TERM GOAL #4   Title decrease frequency, intensity, duration of headaches 02/23/16   Time 6   Period Weeks   Status On-going     PT LONG TERM GOAL #5   Title Patient independent in HEP 02/23/16   Time 6   Period Weeks   Status On-going     PT LONG TERM GOAL #6   Title Improve FOTO to </= 43% limitation 02/23/16   Time 6   Period  Weeks   Status On-going               Plan - 01/20/16 1052    Clinical Impression Statement Pt demonstrated improved lateral neck flexion ROM.   Notable tightness in Lt posterior neck and Rt anterior/ SCM musculature with manual therapy.  Pt somewhat guarded with manual therapy today.  She is making gradual gains towards unmet goals.    Rehab Potential Good   PT Frequency 2x / week   PT Duration 6 weeks   PT Treatment/Interventions Patient/family education;ADLs/Self Care Home Management;Neuromuscular re-education;Cryotherapy;Electrical Stimulation;Iontophoresis 4mg /ml Dexamethasone;Moist Heat;Traction;Ultrasound;Dry needling;Manual techniques;Therapeutic activities;Therapeutic exercise   PT Next Visit Plan continue with postural correction. stretching for cervical and LE tightness; core stabilization; consider taping lumbar spine; manual work through the cervical and occipital area(lumbar as time allows); modalities as indicated.    Consulted and Agree with Plan of Care Patient      Patient will benefit from skilled therapeutic intervention in order to improve the following deficits and impairments:  Postural dysfunction, Improper body mechanics, Pain, Decreased range of motion, Decreased mobility, Increased fascial restricitons, Increased muscle spasms, Decreased activity tolerance  Visit Diagnosis: Cervicalgia  Right-sided low back pain without sciatica  Other symptoms and signs involving the musculoskeletal system  Abnormal posture  Intractable episodic tension-type headache     Problem List Patient Active Problem List   Diagnosis Date Noted  . GAD (generalized anxiety disorder) 12/29/2014  . GERD (gastroesophageal reflux disease) 10/07/2014  . Chronic pain syndrome 10/07/2014  . Carpal tunnel syndrome 09/22/2014  . Plantar fasciitis, bilateral 09/22/2014  . Obstructive sleep apnea 09/22/2014  . Psoriasis 09/22/2014   Mayer Camel, PTA 01/20/16 10:57  AM  [facility] [address] [place], [place], [address] Phone: [phone]   Fax:  [phone]  Name: Desiree Hughes MRN: [id number] Date of Birth: 1955/12/22

## 2016-01-24 ENCOUNTER — Ambulatory Visit (INDEPENDENT_AMBULATORY_CARE_PROVIDER_SITE_OTHER): Payer: PRIVATE HEALTH INSURANCE | Admitting: Physical Therapy

## 2016-01-24 DIAGNOSIS — R293 Abnormal posture: Secondary | ICD-10-CM | POA: Diagnosis not present

## 2016-01-24 DIAGNOSIS — R29898 Other symptoms and signs involving the musculoskeletal system: Secondary | ICD-10-CM | POA: Diagnosis not present

## 2016-01-24 DIAGNOSIS — M542 Cervicalgia: Secondary | ICD-10-CM

## 2016-01-24 DIAGNOSIS — G44211 Episodic tension-type headache, intractable: Secondary | ICD-10-CM

## 2016-01-24 DIAGNOSIS — M545 Low back pain, unspecified: Secondary | ICD-10-CM

## 2016-01-24 NOTE — Therapy (Signed)
[place], [place], [address] Phone: [phone]   Fax:  [phone]  Physical Therapy Treatment  Patient Details  Name: Desiree Hughes MRN: [id number] Date of Birth: 1955-09-14 Referring Provider: Dr. Madilyn Fireman  Encounter Date: 01/24/2016      PT End of Session - 01/24/16 0935    Visit Number 4   Number of Visits 12   Date for PT Re-Evaluation 02/23/16   PT Start Time 0851   PT Stop Time 0951   PT Time Calculation (min) 60 min   Activity Tolerance Patient limited by pain      Past Medical History:  Diagnosis Date  . Herniated disc, cervical   . Hypertension     Past Surgical History:  Procedure Laterality Date  . TUBAL LIGATION      There were no vitals filed for this visit.      Subjective Assessment - 01/24/16 0855    Subjective Pt reports she had to carry (25#) puppy for a block, then when she put the puppy down, she pulled the pt for a while.  Pt complains of continued headache on Rt side of head and low back pain.   She's noticed intermittent numbness and tingling hands (R>L) over last few day as well.    Patient Stated Goals get rid of pain - no more headaches - being able to walk puppy without pain    Currently in Pain? Yes   Pain Score 5    Pain Location Head   Pain Orientation Right;Posterior   Pain Descriptors / Indicators Tingling;Throbbing   Aggravating Factors  early morning    Pain Relieving Factors ice, medication    Pain Score 5   Pain Location Neck   Pain Orientation Right;Left   Aggravating Factors  using arms reaching    Pain Relieving Factors ice, meds          [facility] Adult PT Treatment/Exercise - 01/24/16 0001      Exercises   Exercises Neck;Lumbar     Lumbar Exercises: Stretches   Passive Hamstring Stretch 3 reps;30 seconds   Piriformis Stretch 3 reps;30 seconds     Lumbar Exercises: Aerobic   Stationary Bike NuStep (arms/legs) L4: 6 min      Lumbar Exercises: Supine   Clam 10 reps  with ab st   Bent Knee Raise 10 reps  with ab set   Bent Knee Raise Limitations some pain with Rt hip flexion      Moist Heat Therapy   Number Minutes Moist Heat 20 Minutes   Moist Heat Location Cervical;Lumbar Spine     Electrical Stimulation   Electrical Stimulation Location bilat cervical parapsinals and upper trap    Electrical Stimulation Action IFC   Electrical Stimulation Parameters to tolerance    Electrical Stimulation Goals Pain;Tone     Manual Therapy   Manual therapy comments pt supine.    Myofascial Release cervical paraspinals,  upper trap,  suboccipital release   Passive ROM attempted gentle rotation / lateral neck flexion- pt resisted and had increased guarding.      Neck Exercises: Stretches   Upper Trapezius Stretch 3 reps;20 seconds   Other Neck Stretches 3 way doorway stretch 30 sec x 3 each position    Other Neck Stretches cervical diagonals x 15 sec each direction x 2 reps            PT Long Term Goals - 01/17/16 3545  PT LONG TERM GOAL #1   Title Patient to demonstrate upright posture with good alignment of head on body 02/23/16   Time 6   Period Weeks   Status On-going     PT LONG TERM GOAL #2   Title Cervical and lumbar ROM WFL's and pain free 02/23/16   Time 6   Period Weeks   Status On-going     PT LONG TERM GOAL #3   Title Patient reports return to normal functional activities with minimal to no pain or discomfort 02/23/16   Time 6   Period Weeks   Status On-going     PT LONG TERM GOAL #4   Title decrease frequency, intensity, duration of headaches 02/23/16   Time 6   Period Weeks   Status On-going     PT LONG TERM GOAL #5   Title Patient independent in HEP 02/23/16   Time 6   Period Weeks   Status On-going     PT LONG TERM GOAL #6   Title Improve FOTO to </= 43% limitation 02/23/16   Time 6   Period Weeks   Status On-going               Plan - 01/24/16 1126    Clinical  Impression Statement Pt had difficulty tolerating therapeutic exercise today due to increased pain.  Pt also continues to be guarded with manual therapy.  Pt reported decrease in headache at end of session.  No new goals met today.    Rehab Potential Good   PT Frequency 2x / week   PT Duration 6 weeks   PT Treatment/Interventions Patient/family education;ADLs/Self Care Home Management;Neuromuscular re-education;Cryotherapy;Electrical Stimulation;Iontophoresis '4mg'$ /ml Dexamethasone;Moist Heat;Traction;Ultrasound;Dry needling;Manual techniques;Therapeutic activities;Therapeutic exercise   PT Next Visit Plan Manual therapy vs TDN;  continue spinal stability exercises and stretches.     Consulted and Agree with Plan of Care Patient      Patient will benefit from skilled therapeutic intervention in order to improve the following deficits and impairments:  Postural dysfunction, Improper body mechanics, Pain, Decreased range of motion, Decreased mobility, Increased fascial restricitons, Increased muscle spasms, Decreased activity tolerance  Visit Diagnosis: Cervicalgia  Acute right-sided low back pain without sciatica  Other symptoms and signs involving the musculoskeletal system  Abnormal posture  Intractable episodic tension-type headache     Problem List Patient Active Problem List   Diagnosis Date Noted  . GAD (generalized anxiety disorder) 12/29/2014  . GERD (gastroesophageal reflux disease) 10/07/2014  . Chronic pain syndrome 10/07/2014  . Carpal tunnel syndrome 09/22/2014  . Plantar fasciitis, bilateral 09/22/2014  . Obstructive sleep apnea 09/22/2014  . Psoriasis 09/22/2014   Kerin Perna, PTA 01/24/16 11:36 AM  [place], [place], [address] Phone: [phone]   Fax:  [phone]  Name: Desiree Hughes MRN: [id number] Date of Birth: 03/24/1956

## 2016-01-24 NOTE — Patient Instructions (Signed)
Piriformis Stretch, Sitting PNF    Sit, one ankle on opposite knee, same-side hand on crossed knee. Lean torso forward until tension is felt in hamstrings and gluteals of crossed-leg side.  Hold _15-30__ seconds. Release tension and bring torso closer to calf.  Repeat _2__ times per session. Do _1-2__ sessions per day.  Hamstring Step 1    Straighten left knee. Keep knee level with other knee or on bolster. Hold _30__ seconds. Relax knee by returning foot to start. Repeat _2-3__ times.   [facility] [address] [place], [place] [address]  [phone] (office) [phone])

## 2016-01-27 ENCOUNTER — Ambulatory Visit (INDEPENDENT_AMBULATORY_CARE_PROVIDER_SITE_OTHER): Payer: PRIVATE HEALTH INSURANCE | Admitting: Physical Therapy

## 2016-01-27 ENCOUNTER — Encounter (INDEPENDENT_AMBULATORY_CARE_PROVIDER_SITE_OTHER): Payer: Self-pay

## 2016-01-27 DIAGNOSIS — G44211 Episodic tension-type headache, intractable: Secondary | ICD-10-CM

## 2016-01-27 DIAGNOSIS — M542 Cervicalgia: Secondary | ICD-10-CM | POA: Diagnosis not present

## 2016-01-27 DIAGNOSIS — M545 Low back pain, unspecified: Secondary | ICD-10-CM

## 2016-01-27 DIAGNOSIS — R293 Abnormal posture: Secondary | ICD-10-CM | POA: Diagnosis not present

## 2016-01-27 DIAGNOSIS — R29898 Other symptoms and signs involving the musculoskeletal system: Secondary | ICD-10-CM | POA: Diagnosis not present

## 2016-01-27 NOTE — Therapy (Signed)
[facility] [place] [address] [place], [place], [address] Phone: [phone]   Fax:  [phone]  Physical Therapy Treatment  Patient Details  Name: Desiree Hughes MRN: [id number] Date of Birth: Nov 07, 1955 Referring Provider: Dr. Linford Arnold  Encounter Date: 01/27/2016      PT End of Session - 01/27/16 0804    Visit Number 5   Number of Visits 12   Date for PT Re-Evaluation 02/23/16   PT Start Time 0805   PT Stop Time 0900   PT Time Calculation (min) 55 min   Activity Tolerance Patient limited by pain      Past Medical History:  Diagnosis Date  . Herniated disc, cervical   . Hypertension     Past Surgical History:  Procedure Laterality Date  . TUBAL LIGATION      There were no vitals filed for this visit.      Subjective Assessment - 01/27/16 0807    Subjective Pt reports her headache has continued (headache is not as bad in morning, by end of day it is intense).  Her back also continues to hurt with bending to put leash on dog and household chores.     Patient Stated Goals get rid of pain - no more headaches - being able to walk puppy without pain    Currently in Pain? Yes   Pain Score 3    Pain Location Head   Pain Score --  0/10 at rest, spikes of 7/10 with bending, twisting    Pain Location Back   Pain Orientation Right;Left   Pain Descriptors / Indicators Aching            [facility] PT Assessment - 01/27/16 0001      Assessment   Medical Diagnosis Cervical strain; migraines; LBP   Referring Provider Dr. Linford Arnold   Onset Date/Surgical Date 12/24/15   Hand Dominance Right   Next MD Visit not scheduled yet.           [facility] Adult PT Treatment/Exercise - 01/27/16 0001      Lumbar Exercises: Stretches   Passive Hamstring Stretch 2 reps;60 seconds   Single Knee to Chest Stretch 1 rep;30 seconds  within tolerance    Prone on Elbows Stretch 3 reps;10 seconds   Piriformis Stretch 1 rep;30 seconds      Lumbar Exercises: Aerobic   Stationary Bike NuStep (arms/legs) L4: 6 min      Lumbar Exercises: Supine   Clam 10 reps  with ab set   Bent Knee Raise 10 reps  with ab set,  LLE only.  (RLE caused sharp radiating pain)     Moist Heat Therapy   Number Minutes Moist Heat 20 Minutes   Moist Heat Location Cervical;Lumbar Spine  during stretches, and at end of session     Electrical Stimulation   Electrical Stimulation Location Rt levator/  Rt low back paraspinals   Electrical Stimulation Action premod to each area    Electrical Stimulation Parameters  to tolerance    Electrical Stimulation Goals Pain;Tone     Manual Therapy   Manual therapy comments Pt in supported hooklying.    Soft tissue mobilization ant/lat/post cervical; upper traps,  temporal muscles   Myofascial Release cervical paraspinals,  upper trap,  suboccipital release, Rt/Lt pec and anterior neck musculature.    Passive ROM gentle rotation / lateral neck flexion     Neck Exercises: Stretches   Upper Trapezius Stretch 3 reps;20 seconds   Other Neck  Stretches 3 way doorway stretch 30 sec each    Other Neck Stretches prone on elbows, cervical flexion to neutral.            PT Long Term Goals - 01/17/16 0846      PT LONG TERM GOAL #1   Title Patient to demonstrate upright posture with good alignment of head on body 02/23/16   Time 6   Period Weeks   Status On-going     PT LONG TERM GOAL #2   Title Cervical and lumbar ROM WFL's and pain free 02/23/16   Time 6   Period Weeks   Status On-going     PT LONG TERM GOAL #3   Title Patient reports return to normal functional activities with minimal to no pain or discomfort 02/23/16   Time 6   Period Weeks   Status On-going     PT LONG TERM GOAL #4   Title decrease frequency, intensity, duration of headaches 02/23/16   Time 6   Period Weeks   Status On-going     PT LONG TERM GOAL #5   Title Patient independent in HEP 02/23/16   Time 6   Period Weeks   Status  On-going     PT LONG TERM GOAL #6   Title Improve FOTO to </= 43% limitation 02/23/16   Time 6   Period Weeks   Status On-going               Plan - 01/27/16 16100823    Clinical Impression Statement Pt unable to tolerate supine hip flexion on RLE, due to increased pain in back and down RLE.  Pt had less guarding with manual therapy today; continued tightness noted in Rt suboccipital muscles/ upper trap.   Pt reported significant improvement in headache and LBP at end of session.     Rehab Potential Good   PT Frequency 2x / week   PT Duration 6 weeks   PT Treatment/Interventions Patient/family education;ADLs/Self Care Home Management;Neuromuscular re-education;Cryotherapy;Electrical Stimulation;Iontophoresis 4mg /ml Dexamethasone;Moist Heat;Traction;Ultrasound;Dry needling;Manual techniques;Therapeutic activities;Therapeutic exercise   PT Next Visit Plan TDN/ Manual therapy/ spinal mobs as indicated.   Assess goals and ROM for continued therapy (insurance coverage)   Consulted and Agree with Plan of Care Patient      Patient will benefit from skilled therapeutic intervention in order to improve the following deficits and impairments:  Postural dysfunction, Improper body mechanics, Pain, Decreased range of motion, Decreased mobility, Increased fascial restricitons, Increased muscle spasms, Decreased activity tolerance  Visit Diagnosis: Cervicalgia  Acute right-sided low back pain without sciatica  Other symptoms and signs involving the musculoskeletal system  Abnormal posture  Intractable episodic tension-type headache     Problem List Patient Active Problem List   Diagnosis Date Noted  . GAD (generalized anxiety disorder) 12/29/2014  . GERD (gastroesophageal reflux disease) 10/07/2014  . Chronic pain syndrome 10/07/2014  . Carpal tunnel syndrome 09/22/2014  . Plantar fasciitis, bilateral 09/22/2014  . Obstructive sleep apnea 09/22/2014  . Psoriasis 09/22/2014    Mayer CamelJennifer Carlson-Long, PTA 01/27/16 11:59 AM  [facility] [address] [place], [place], [address] Phone: [phone]   Fax:  [phone]  Name: Desiree BushmanCindy Hughes MRN: [id number] Date of Birth: [birth year]

## 2016-01-31 ENCOUNTER — Encounter: Payer: Self-pay | Admitting: Rehabilitative and Restorative Service Providers"

## 2016-01-31 ENCOUNTER — Ambulatory Visit (INDEPENDENT_AMBULATORY_CARE_PROVIDER_SITE_OTHER): Payer: PRIVATE HEALTH INSURANCE | Admitting: Rehabilitative and Restorative Service Providers"

## 2016-01-31 DIAGNOSIS — M545 Low back pain, unspecified: Secondary | ICD-10-CM

## 2016-01-31 DIAGNOSIS — R29898 Other symptoms and signs involving the musculoskeletal system: Secondary | ICD-10-CM | POA: Diagnosis not present

## 2016-01-31 DIAGNOSIS — M542 Cervicalgia: Secondary | ICD-10-CM

## 2016-01-31 DIAGNOSIS — R293 Abnormal posture: Secondary | ICD-10-CM | POA: Diagnosis not present

## 2016-01-31 DIAGNOSIS — G44211 Episodic tension-type headache, intractable: Secondary | ICD-10-CM

## 2016-01-31 NOTE — Therapy (Signed)
[facility] [address] [place], [place], [address] Phone: [phone]   Fax:  [phone]  Physical Therapy Treatment  Patient Details  Name: Desiree BushmanCindy Hughes MRN: [id number] Date of Birth: 1956/02/23 Referring Provider: Dr Linford ArnoldMetheney  Encounter Date: 01/31/2016      PT End of Session - 01/31/16 0851    Visit Number 6   Number of Visits 12   Date for PT Re-Evaluation 02/23/16   PT Start Time 0848   PT Stop Time 0941   PT Time Calculation (min) 53 min   Activity Tolerance Patient tolerated treatment well      Past Medical History:  Diagnosis Date  . Herniated disc, cervical   . Hypertension     Past Surgical History:  Procedure Laterality Date  . TUBAL LIGATION      There were no vitals filed for this visit.      Subjective Assessment - 01/31/16 0851    Subjective Pt reports her headache has continued (headache is not as bad in morning, by end of day it is intense).  Her back also continues to hurt with certain movements and household chores.  Interested in trying TDN    Pain Score 2    Pain Location Head   Pain Orientation Right;Posterior   Pain Descriptors / Indicators Throbbing   Pain Type Acute pain   Pain Onset More than a month ago   Pain Frequency Intermittent   Pain Score 2   Pain Location Back   Pain Orientation Right;Left   Pain Descriptors / Indicators Aching   Pain Type Acute pain   Pain Onset More than a month ago   Pain Frequency Intermittent            [facility] PT Assessment - 01/31/16 0001      Assessment   Medical Diagnosis Cervical strain; migraines; LBP   Referring Provider Dr Linford ArnoldMetheney   Onset Date/Surgical Date 12/24/15   Hand Dominance Right   Next MD Visit not scheduled yet.      AROM   Cervical Flexion 44   Cervical Extension 36   Cervical - Right Side Bend 32   Cervical - Left Side Bend 39   Cervical - Right Rotation 61   Cervical - Left Rotation 58     Palpation    Palpation comment tenderness and tightness through the Rt lumbar paraspinals and QL as well as the bialt occipital and cervical paraspinals/upper trap/pecs - cervical tighter Rt - upper trap tighter Lt                      [facility] Adult PT Treatment/Exercise - 01/31/16 0001      Neck Exercises: Supine   Neck Retraction 10 reps;10 secs     Lumbar Exercises: Stretches   Passive Hamstring Stretch 2 reps;60 seconds   Single Knee to Chest Stretch 1 rep;30 seconds  within tolerance    Piriformis Stretch 1 rep;30 seconds     Lumbar Exercises: Aerobic   Stationary Bike NuStep (arms - 9 - /legs) L5: 5 min      Lumbar Exercises: Supine   Clam 10 reps  with ab set   Bent Knee Raise 10 reps  with core engaged   Bridge 10 reps;2 seconds     Moist Heat Therapy   Number Minutes Moist Heat 20 Minutes   Moist Heat Location Cervical;Lumbar Spine  during stretches, and at end of session     Electrical  Stimulation   Electrical Stimulation Location bilat cervical and upper trap    Electrical Stimulation Action IFC   Electrical Stimulation Parameters to tolerance   Electrical Stimulation Goals Pain;Tone     Manual Therapy   Manual therapy comments Pt in supported hooklying.    Soft tissue mobilization ant/lat/post cervical; upper traps,  temporal muscles   Myofascial Release cervical paraspinals,  upper trap,  suboccipital release, Rt/Lt pec and anterior neck musculature.    Passive ROM into cervical flexion; cervical flexion with slight rotation    Manual Traction cervical through treatment several reps mod pull 20-30 sec hold      Neck Exercises: Stretches   Upper Trapezius Stretch 3 reps;20 seconds   Other Neck Stretches 3 way doorway stretch 30 sec each                      PT Long Term Goals - 01/31/16 0935      PT LONG TERM GOAL #1   Title Patient to demonstrate upright posture with good alignment of head on body 02/23/16   Time 6   Period Weeks    Status On-going     PT LONG TERM GOAL #2   Title Cervical and lumbar ROM WFL's and pain free 02/23/16   Baseline good gains in cervical mobility noted - see ROM measurements   Time 6   Period Weeks   Status On-going     PT LONG TERM GOAL #3   Title Patient reports return to normal functional activities with minimal to no pain or discomfort 02/23/16   Time 6   Period Weeks   Status On-going     PT LONG TERM GOAL #4   Title decrease frequency, intensity, duration of headaches 02/23/16   Baseline decreased c/o headaches and headaches she has are more "mild"   Time 6   Period Weeks   Status On-going     PT LONG TERM GOAL #5   Title Patient independent in HEP 02/23/16   Time 6   Period Weeks   Status On-going     PT LONG TERM GOAL #6   Title Improve FOTO to </= 43% limitation 02/23/16   Time 6   Period Weeks   Status On-going               Plan - 01/31/16 0934    Clinical Impression Statement Gradual improvement. Some changes in cervical moblity; decreasing pain and tissue tightness to palpation. Will try TDN to cervical and lumbar musculature    Rehab Potential Good   PT Frequency 2x / week   PT Duration 6 weeks   PT Treatment/Interventions Patient/family education;ADLs/Self Care Home Management;Neuromuscular re-education;Cryotherapy;Electrical Stimulation;Iontophoresis 4mg /ml Dexamethasone;Moist Heat;Traction;Ultrasound;Dry needling;Manual techniques;Therapeutic activities;Therapeutic exercise   PT Next Visit Plan TDN/ Manual therapy/ spinal mobs as indicated.   Assess goals and ROM for continued therapy (insurance coverage) Trial of TDN at next visit   Consulted and Agree with Plan of Care Patient      Patient will benefit from skilled therapeutic intervention in order to improve the following deficits and impairments:  Postural dysfunction, Improper body mechanics, Pain, Decreased range of motion, Decreased mobility, Increased fascial restricitons, Increased muscle  spasms, Decreased activity tolerance  Visit Diagnosis: Cervicalgia  Acute right-sided low back pain without sciatica  Other symptoms and signs involving the musculoskeletal system  Abnormal posture  Intractable episodic tension-type headache     Problem List Patient Active Problem List   Diagnosis Date Noted  .  GAD (generalized anxiety disorder) 12/29/2014  . GERD (gastroesophageal reflux disease) 10/07/2014  . Chronic pain syndrome 10/07/2014  . Carpal tunnel syndrome 09/22/2014  . Plantar fasciitis, bilateral 09/22/2014  . Obstructive sleep apnea 09/22/2014  . Psoriasis 09/22/2014    Jorrell Kuster Rober Minion PT, MPH  01/31/2016, 9:37 AM  [facility] [address] [place], [place], [address] Phone: [phone]   Fax:  [phone]  Name: Desiree Hughes MRN: [id number] Date of Birth: 1955/10/01

## 2016-02-03 ENCOUNTER — Encounter: Payer: PRIVATE HEALTH INSURANCE | Admitting: Physical Therapy

## 2016-02-08 ENCOUNTER — Ambulatory Visit (INDEPENDENT_AMBULATORY_CARE_PROVIDER_SITE_OTHER): Payer: PRIVATE HEALTH INSURANCE | Admitting: Rehabilitative and Restorative Service Providers"

## 2016-02-08 ENCOUNTER — Ambulatory Visit (INDEPENDENT_AMBULATORY_CARE_PROVIDER_SITE_OTHER): Payer: PRIVATE HEALTH INSURANCE | Admitting: Family Medicine

## 2016-02-08 ENCOUNTER — Encounter: Payer: Self-pay | Admitting: Family Medicine

## 2016-02-08 ENCOUNTER — Encounter: Payer: Self-pay | Admitting: Rehabilitative and Restorative Service Providers"

## 2016-02-08 VITALS — BP 111/65 | HR 79 | Wt 146.0 lb

## 2016-02-08 DIAGNOSIS — G44211 Episodic tension-type headache, intractable: Secondary | ICD-10-CM

## 2016-02-08 DIAGNOSIS — F5101 Primary insomnia: Secondary | ICD-10-CM | POA: Diagnosis not present

## 2016-02-08 DIAGNOSIS — S161XXA Strain of muscle, fascia and tendon at neck level, initial encounter: Secondary | ICD-10-CM

## 2016-02-08 DIAGNOSIS — R29898 Other symptoms and signs involving the musculoskeletal system: Secondary | ICD-10-CM | POA: Diagnosis not present

## 2016-02-08 DIAGNOSIS — M545 Low back pain, unspecified: Secondary | ICD-10-CM

## 2016-02-08 DIAGNOSIS — G4700 Insomnia, unspecified: Secondary | ICD-10-CM | POA: Insufficient documentation

## 2016-02-08 DIAGNOSIS — R293 Abnormal posture: Secondary | ICD-10-CM | POA: Diagnosis not present

## 2016-02-08 DIAGNOSIS — J301 Allergic rhinitis due to pollen: Secondary | ICD-10-CM | POA: Diagnosis not present

## 2016-02-08 DIAGNOSIS — G4733 Obstructive sleep apnea (adult) (pediatric): Secondary | ICD-10-CM

## 2016-02-08 DIAGNOSIS — M542 Cervicalgia: Secondary | ICD-10-CM

## 2016-02-08 MED ORDER — MOMETASONE FUROATE 50 MCG/ACT NA SUSP: 1.0000 | Freq: Every day | NASAL | 12 refills | Status: DC

## 2016-02-08 MED ORDER — TRAZODONE HCL 50 MG PO TABS: 150.0000 mg | ORAL_TABLET | Freq: Every day | ORAL | 11 refills | Status: DC
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
## 2016-02-08 NOTE — Therapy (Signed)
[place], [place], [address] Phone: [phone]   Fax:  [phone]  Physical Therapy Treatment  Patient Details  Name: Mayela Bullard MRN: [id number] Date of Birth: July 07, 1955 Referring Provider: Dr Madilyn Fireman  Encounter Date: 02/08/2016      PT End of Session - 02/08/16 0912    Visit Number 7   Number of Visits 12   Date for PT Re-Evaluation 02/23/16   PT Start Time 0912   PT Stop Time 1010   PT Time Calculation (min) 58 min   Activity Tolerance Patient tolerated treatment well      Past Medical History:  Diagnosis Date  . Herniated disc, cervical   . Hypertension     Past Surgical History:  Procedure Laterality Date  . TUBAL LIGATION      There were no vitals filed for this visit.      Subjective Assessment - 02/08/16 0914    Subjective Patient reports that she is not doing "too bad" today. Saw MD this am and will continue PT.    Currently in Pain? Yes   Pain Score 2    Pain Location Head   Pain Orientation Right;Posterior   Pain Descriptors / Indicators Dull;Aching   Aggravating Factors  worse in the evening; always 'there in the morning'   Pain Score 2   Pain Location Back   Pain Orientation Right;Left   Pain Descriptors / Indicators Aching   Pain Type Acute pain   Aggravating Factors  reaching or turning; tender to push on it    Pain Location Neck   Pain Orientation Right   Pain Descriptors / Indicators Sore;Aching;Tender   Pain Radiating Towards into head and down into the shoulder area             [facility] PT Assessment - 02/08/16 0001      Assessment   Medical Diagnosis Cervical strain; migraines; LBP   Referring Provider Dr Madilyn Fireman   Onset Date/Surgical Date 12/24/15   Hand Dominance Right   Next MD Visit as needed ~4 weeks      AROM   Cervical Flexion 45   Cervical Extension 46   Cervical - Right Side Bend 31   Cervical - Left Side Bend 37   Cervical -  Right Rotation 61   Cervical - Left Rotation 64                     [facility] Adult PT Treatment/Exercise - 02/08/16 0001      Neck Exercises: Supine   Neck Retraction 10 reps;10 secs   Cervical Rotation Right;Left;5 reps   Lateral Flexion Left;Both  3 reps AA stretch     Lumbar Exercises: Aerobic   Stationary Bike NuStep (arms - 9 - /legs) L5: 5 min      Moist Heat Therapy   Number Minutes Moist Heat 20 Minutes   Moist Heat Location Cervical;Lumbar Spine  during stretches, and at end of session     Electrical Stimulation   Electrical Stimulation Location bilat cervical and upper trap    Electrical Stimulation Action IFC   Electrical Stimulation Parameters to tolerance   Electrical Stimulation Goals Pain;Tone     Manual Therapy   Manual therapy comments Pt in supported hooklying.    Soft tissue mobilization ant/lat/post cervical; upper traps,  temporal muscles   Myofascial Release cervical paraspinals,  upper trap,  suboccipital release, Rt/Lt pec and anterior neck musculature.  Passive ROM into cervical flexion; cervical flexion with slight rotation    Manual Traction cervical through treatment several reps mod pull 20-30 sec hold      Neck Exercises: Stretches   Upper Trapezius Stretch 3 reps;20 seconds   Other Neck Stretches 3 way doorway stretch 30 sec each           Trigger Point Dry Needling - 02/08/16 1005    Consent Given? Yes   Education Handout Provided Yes   Muscles Treated Upper Body Oblique capitus;Suboccipitals muscle group;Longissimus  bilat Rt > Lt    Oblique Capitus Response Palpable increased muscle length   SubOccipitals Response Palpable increased muscle length   Longissimus Response Palpable increased muscle length              PT Education - 02/08/16 0926    Education provided Yes   Education Details TDN   Person(s) Educated Patient   Methods Explanation   Comprehension Verbalized understanding             PT  Long Term Goals - 02/08/16 0913      PT LONG TERM GOAL #1   Title Patient to demonstrate upright posture with good alignment of head on body 02/23/16   Time 6   Period Weeks   Status Partially Met     PT LONG TERM GOAL #2   Title Cervical and lumbar ROM WFL's and pain free 02/23/16   Baseline good gains in cervical mobility noted - see ROM measurements   Period Weeks   Status Partially Met     PT LONG TERM GOAL #3   Title Patient reports return to normal functional activities with minimal to no pain or discomfort 02/23/16   Time 6   Period Weeks   Status Partially Met     PT LONG TERM GOAL #4   Title decrease frequency, intensity, duration of headaches 02/23/16   Time 6   Period Weeks   Status On-going     PT LONG TERM GOAL #5   Title Patient independent in HEP 02/23/16   Time 6   Period Weeks   Status On-going     PT LONG TERM GOAL #6   Title Improve FOTO to </= 43% limitation 02/23/16   Time 6   Period Weeks   Status On-going               Plan - 02/08/16 1005    Clinical Impression Statement Patient continues to demonstrate gradual porgress with rehab. She has increase in ROM in some cervical ranges. She has decreased palpable tightness in the cervical and upper trap areas and demonstrates improving posture and alignment. Decreased tightness with TDN, manual work and stretching noted. Patient reports increase in HA and onset of muscle soreness following TDN.    Rehab Potential Good   PT Frequency 2x / week   PT Duration 6 weeks   PT Treatment/Interventions Patient/family education;ADLs/Self Care Home Management;Neuromuscular re-education;Cryotherapy;Electrical Stimulation;Iontophoresis '4mg'$ /ml Dexamethasone;Moist Heat;Traction;Ultrasound;Dry needling;Manual techniques;Therapeutic activities;Therapeutic exercise   PT Next Visit Plan TDN/ Manual therapy/ spinal mobs as indicated.   Assess response to TDN; progress exercise and activity level.     Consulted and Agree  with Plan of Care Patient      Patient will benefit from skilled therapeutic intervention in order to improve the following deficits and impairments:  Postural dysfunction, Improper body mechanics, Pain, Decreased range of motion, Decreased mobility, Increased fascial restricitons, Increased muscle spasms, Decreased activity tolerance  Visit Diagnosis: Cervicalgia  Acute right-sided low back pain without sciatica  Other symptoms and signs involving the musculoskeletal system  Abnormal posture  Intractable episodic tension-type headache     Problem List Patient Active Problem List   Diagnosis Date Noted  . Insomnia 02/08/2016  . GAD (generalized anxiety disorder) 12/29/2014  . GERD (gastroesophageal reflux disease) 10/07/2014  . Chronic pain syndrome 10/07/2014  . Carpal tunnel syndrome 09/22/2014  . Obstructive sleep apnea 09/22/2014  . Psoriasis 09/22/2014    Jarmaine Ehrler Nilda Simmer PT, MPH  02/08/2016, 10:10 AM  [facility] [place], [place], [address] Phone: [phone]   Fax:  [phone]  Name: Raeonna Milo MRN: [id number] Date of Birth: 04-05-1956

## 2016-02-08 NOTE — Patient Instructions (Addendum)
Trigger Point Dry Needling  . What is Trigger Point Dry Needling (DN)? o DN is a physical therapy technique used to treat muscle pain and dysfunction. Specifically, DN helps deactivate muscle trigger points (muscle knots).  o A thin filiform needle is used to penetrate the skin and stimulate the underlying trigger point. The goal is for a local twitch response (LTR) to occur and for the trigger point to relax. No medication of any kind is injected during the procedure.   . What Does Trigger Point Dry Needling Feel Like?  o The procedure feels different for each individual patient. Some patients report that they do not actually feel the needle enter the skin and overall the process is not painful. Very mild bleeding may occur. However, many patients feel a deep cramping in the muscle in which the needle was inserted. This is the local twitch response.   Marland Kitchen. How Will I feel after the treatment? o Soreness is normal, and the onset of soreness may not occur for a few hours. Typically this soreness does not last longer than two days.  o Bruising is uncommon, however; ice can be used to decrease any possible bruising.  o In rare cases feeling tired or nauseous after the treatment is normal. In addition, your symptoms may get worse before they get better, this period will typically not last longer than 24 hours.   . What Can I do After My Treatment? o Increase your hydration by drinking more water for the next 24 hours. o You may place ice or heat on the areas treated that have become sore, however, do not use heat on inflamed or bruised areas. Heat often brings more relief post needling. o You can continue your regular activities, but vigorous activity is not recommended initially after the treatment for 24 hours. o DN is best combined with other physical therapy such as strengthening, stretching, and other therapies.   Side Bend, lying on back head supported on a thin pillow  Keep chin  tucked    Place hand over top of head. Gently pull head to one side. Hold _20__ seconds. Repeat __3_ times per session. Do _2-3__ sessions per day.

## 2016-02-10 ENCOUNTER — Ambulatory Visit (INDEPENDENT_AMBULATORY_CARE_PROVIDER_SITE_OTHER): Payer: PRIVATE HEALTH INSURANCE | Admitting: Physical Therapy

## 2016-02-10 DIAGNOSIS — M545 Low back pain, unspecified: Secondary | ICD-10-CM

## 2016-02-10 DIAGNOSIS — R293 Abnormal posture: Secondary | ICD-10-CM | POA: Diagnosis not present

## 2016-02-10 DIAGNOSIS — M542 Cervicalgia: Secondary | ICD-10-CM | POA: Diagnosis not present

## 2016-02-10 DIAGNOSIS — R29898 Other symptoms and signs involving the musculoskeletal system: Secondary | ICD-10-CM

## 2016-02-10 NOTE — Therapy (Signed)
[place], [place], [address] Phone: [phone]   Fax:  [phone]  Physical Therapy Treatment  Patient Details  Name: Desiree Hughes MRN: [id number] Date of Birth: Mar 09, 1956 Referring Provider: Dr. Madilyn Fireman  Encounter Date: 02/10/2016      PT End of Session - 02/10/16 1020    Visit Number 8   Number of Visits 12   Date for PT Re-Evaluation 02/23/16   PT Start Time 1018   PT Stop Time 1117   PT Time Calculation (min) 59 min   Activity Tolerance Patient tolerated treatment well;No increased pain      Past Medical History:  Diagnosis Date  . Herniated disc, cervical   . Hypertension     Past Surgical History:  Procedure Laterality Date  . TUBAL LIGATION      There were no vitals filed for this visit.      Subjective Assessment - 02/10/16 1020    Subjective Desiree Hughes states she is tender to touch in her neck; no headache today.  Her low back doesn't hurt all the time, mostly just when twisting to look before turning a corner when driving.     Patient Stated Goals get rid of pain - no more headaches - being able to walk puppy without pain    Currently in Pain? Yes   Pain Score 1    Pain Location Neck   Pain Orientation Right;Posterior   Pain Descriptors / Indicators Tender   Pain Score 4   Pain Location Back   Pain Orientation Right;Left;Lower   Pain Descriptors / Indicators Dull;Aching   Aggravating Factors  turning   Pain Relieving Factors ice, medication.             [facility] PT Assessment - 02/10/16 0001      Assessment   Medical Diagnosis Cervical strain; migraines; LBP   Referring Provider Dr. Madilyn Fireman   Onset Date/Surgical Date 12/24/15   Hand Dominance Right   Next MD Visit as needed ~4 weeks           [facility] Adult PT Treatment/Exercise - 02/10/16 0001      Neck Exercises: Prone   Axial Exentsion 10 reps     Lumbar Exercises: Stretches   Passive Hamstring  Stretch 2 reps;60 seconds   Piriformis Stretch 2 reps;30 seconds     Lumbar Exercises: Aerobic   Stationary Bike NuStep (arms - 9 - /legs) L5: 5 min      Lumbar Exercises: Standing   Other Standing Lumbar Exercises Calf stretches x 30 sec x 2 reps each leg.      Lumbar Exercises: Prone   Opposite Arm/Leg Raise Right arm/Left leg;Left arm/Right leg;5 reps  2 sets     Lumbar Exercises: Quadruped   Single Arm Raise Right;Left;5 reps   Opposite Arm/Leg Raise Right arm/Left leg;Left arm/Right leg;10 reps - followed by childs pose with lateral trunk flexion 3 reps      Modalities   Modalities Ultrasound     Moist Heat Therapy   Number Minutes Moist Heat 15 Minutes   Moist Heat Location Cervical;Lumbar Spine     Electrical Stimulation   Electrical Stimulation Location cervical paraspinals/ Rt lumbar    Electrical Stimulation Action premod to each area   Electrical Stimulation Parameters to tolerance    Electrical Stimulation Goals Pain;Tone     Ultrasound   Ultrasound Location Rt lumbar paraspinals.    Ultrasound Parameters 100%, 1.2 w/cm2, 8 min  Ultrasound Goals Pain  tightness     Manual Therapy   Myofascial Release suboccipital release.      Neck Exercises: Stretches   Upper Trapezius Stretch 3 reps;20 seconds   Other Neck Stretches 3 way doorway stretch 30 sec each - 2 sets            PT Long Term Goals - 02/08/16 0913      PT LONG TERM GOAL #1   Title Patient to demonstrate upright posture with good alignment of head on body 02/23/16   Time 6   Period Weeks   Status Partially Met     PT LONG TERM GOAL #2   Title Cervical and lumbar ROM WFL's and pain free 02/23/16   Baseline good gains in cervical mobility noted - see ROM measurements   Period Weeks   Status Partially Met     PT LONG TERM GOAL #3   Title Patient reports return to normal functional activities with minimal to no pain or discomfort 02/23/16   Time 6   Period Weeks   Status Partially Met      PT LONG TERM GOAL #4   Title decrease frequency, intensity, duration of headaches 02/23/16   Time 6   Period Weeks   Status On-going     PT LONG TERM GOAL #5   Title Patient independent in HEP 02/23/16   Time 6   Period Weeks   Status On-going     PT LONG TERM GOAL #6   Title Improve FOTO to </= 43% limitation 02/23/16   Time 6   Period Weeks   Status On-going               Plan - 02/10/16 1108    Clinical Impression Statement Pt tolerted all exercises well without any increase in pain.  She had positive response to TDN last session, noting less tension in neck and decreased headaches.  Making gradual prgress towards remaining goals.    Rehab Potential Good   PT Frequency 2x / week   PT Duration 6 weeks   PT Treatment/Interventions Patient/family education;ADLs/Self Care Home Management;Neuromuscular re-education;Cryotherapy;Electrical Stimulation;Iontophoresis '4mg'$ /ml Dexamethasone;Moist Heat;Traction;Ultrasound;Dry needling;Manual techniques;Therapeutic activities;Therapeutic exercise   PT Next Visit Plan TDN/ Manual therapy/ spinal mobs as indicated.   progress exercise and activity level.     Consulted and Agree with Plan of Care Patient      Patient will benefit from skilled therapeutic intervention in order to improve the following deficits and impairments:  Postural dysfunction, Improper body mechanics, Pain, Decreased range of motion, Decreased mobility, Increased fascial restricitons, Increased muscle spasms, Decreased activity tolerance  Visit Diagnosis: Cervicalgia  Acute right-sided low back pain without sciatica  Other symptoms and signs involving the musculoskeletal system  Abnormal posture     Problem List Patient Active Problem List   Diagnosis Date Noted  . Insomnia 02/08/2016  . GAD (generalized anxiety disorder) 12/29/2014  . GERD (gastroesophageal reflux disease) 10/07/2014  . Chronic pain syndrome 10/07/2014  . Carpal tunnel syndrome  09/22/2014  . Obstructive sleep apnea 09/22/2014  . Psoriasis 09/22/2014   Kerin Perna, PTA 02/10/16 12:47 PM  [place], [place], [address] Phone: [phone]   Fax:  [phone]  Name: Desiree Hughes MRN: [id number] Date of Birth: Jun 27, 1955

## 2016-02-14 ENCOUNTER — Telehealth: Payer: Self-pay | Admitting: *Deleted

## 2016-02-14 ENCOUNTER — Ambulatory Visit (INDEPENDENT_AMBULATORY_CARE_PROVIDER_SITE_OTHER): Payer: PRIVATE HEALTH INSURANCE | Admitting: Physical Therapy

## 2016-02-14 DIAGNOSIS — J309 Allergic rhinitis, unspecified: Secondary | ICD-10-CM

## 2016-02-14 DIAGNOSIS — M545 Low back pain, unspecified: Secondary | ICD-10-CM

## 2016-02-14 DIAGNOSIS — M542 Cervicalgia: Secondary | ICD-10-CM | POA: Diagnosis not present

## 2016-02-14 DIAGNOSIS — R29898 Other symptoms and signs involving the musculoskeletal system: Secondary | ICD-10-CM | POA: Diagnosis not present

## 2016-02-14 NOTE — Therapy (Signed)
[place], [place], [address] Phone: [phone]   Fax:  [phone]  Physical Therapy Treatment  Patient Details  Name: Desiree Hughes MRN: [id number] Date of Birth: Jul 22, 1955 Referring Provider: Dr. Madilyn Fireman  Encounter Date: 02/14/2016      PT End of Session - 02/14/16 1203    Visit Number 9   Number of Visits 12   Date for PT Re-Evaluation 02/23/16   PT Start Time 1150   PT Stop Time 9449   PT Time Calculation (min) 65 min   Activity Tolerance Patient tolerated treatment well;No increased pain   Behavior During Therapy WFL for tasks assessed/performed      Past Medical History:  Diagnosis Date  . Herniated disc, cervical   . Hypertension     Past Surgical History:  Procedure Laterality Date  . TUBAL LIGATION      There were no vitals filed for this visit.      Subjective Assessment - 02/14/16 1157    Subjective Pt reports her neck continues to feel tender.  She continues to get tingling up back of neck if she turns a certain way.   Her low back is tender, just enough to let her know it is there.  2 days ago she slipped on trail and fell on buttocks.     Currently in Pain? Yes   Pain Score 3    Pain Location Back   Pain Orientation Lower   Pain Descriptors / Indicators Tender   Aggravating Factors  twisting, bending, prolonged standing    Pain Relieving Factors laying on flat ground            [facility] PT Assessment - 02/14/16 0001      Assessment   Medical Diagnosis Cervical strain; migraines; LBP   Referring Provider Dr. Madilyn Fireman   Onset Date/Surgical Date 12/24/15   Hand Dominance Right     AROM   Cervical Flexion 52   Cervical Extension 46  with pain   Cervical - Right Side Bend 35  with pain   Cervical - Left Side Bend 31          [facility] Adult PT Treatment/Exercise - 02/14/16 0001      Neck Exercises: Prone   Other Prone Exercise childs pose x 3 reps       Lumbar Exercises: Stretches   Passive Hamstring Stretch 2 reps;30 seconds   Double Knee to Chest Stretch 2 reps;30 seconds   Piriformis Stretch 30 seconds;3 reps     Lumbar Exercises: Aerobic   Stationary Bike NuStep (arms - 9 - /legs) L4: 6 min      Lumbar Exercises: Standing   Other Standing Lumbar Exercises Calf stretches x 30 sec x 2 reps each leg.   Soleus stretch x 20 sec each side.      Lumbar Exercises: Quadruped   Opposite Arm/Leg Raise Right arm/Left leg;Left arm/Right leg;5 seconds;5 reps     Modalities   Modalities Ultrasound;Electrical Stimulation;Moist Heat     Moist Heat Therapy   Number Minutes Moist Heat 15 Minutes   Moist Heat Location Cervical;Lumbar Spine     Electrical Stimulation   Electrical Stimulation Location cervical paraspinals/ Rt lumbar    Electrical Stimulation Action premod to each area   Electrical Stimulation Parameters to tolerance   Electrical Stimulation Goals Pain;Tone     Ultrasound   Ultrasound Location Rt lumbar paraspinals / QL   Ultrasound Parameters 100%, 1.1 w/cm2, 8 min  Ultrasound Goals Pain  tightness     Neck Exercises: Stretches   Upper Trapezius Stretch 3 reps;20 seconds   Other Neck Stretches 3 way doorway stretch 30 sec each - 2 sets           PT Long Term Goals - 02/08/16 0913      PT LONG TERM GOAL #1   Title Patient to demonstrate upright posture with good alignment of head on body 02/23/16   Time 6   Period Weeks   Status Partially Met     PT LONG TERM GOAL #2   Title Cervical and lumbar ROM WFL's and pain free 02/23/16   Baseline good gains in cervical mobility noted - see ROM measurements   Period Weeks   Status Partially Met     PT LONG TERM GOAL #3   Title Patient reports return to normal functional activities with minimal to no pain or discomfort 02/23/16   Time 6   Period Weeks   Status Partially Met     PT LONG TERM GOAL #4   Title decrease frequency, intensity, duration of headaches  02/23/16   Time 6   Period Weeks   Status On-going     PT LONG TERM GOAL #5   Title Patient independent in HEP 02/23/16   Time 6   Period Weeks   Status On-going     PT LONG TERM GOAL #6   Title Improve FOTO to </= 43% limitation 02/23/16   Time 6   Period Weeks   Status On-going               Plan - 02/14/16 1259    Clinical Impression Statement Pt tolerated exercise without increase in pain.  Pt demonstrated improved cervical flexion, but cont to have pain and tightness with Rt lateral flexion and ext.  Neck pain has decreased overall.  Pt reported decreased tenderness after [place] and estim to Rt lumbar spine.  Progressing towards remaining goals.    Rehab Potential Good   PT Frequency 2x / week   PT Duration 6 weeks   PT Treatment/Interventions Patient/family education;ADLs/Self Care Home Management;Neuromuscular re-education;Cryotherapy;Electrical Stimulation;Iontophoresis 34m/ml Dexamethasone;Moist Heat;Traction;Ultrasound;Dry needling;Manual techniques;Therapeutic activities;Therapeutic exercise   PT Next Visit Plan TDN/ Manual therapy/ spinal mobs as indicated.   progress exercise and activity level.     Consulted and Agree with Plan of Care Patient      Patient will benefit from skilled therapeutic intervention in order to improve the following deficits and impairments:  Postural dysfunction, Improper body mechanics, Pain, Decreased range of motion, Decreased mobility, Increased fascial restricitons, Increased muscle spasms, Decreased activity tolerance  Visit Diagnosis: Cervicalgia  Acute right-sided low back pain without sciatica  Other symptoms and signs involving the musculoskeletal system     Problem List Patient Active Problem List   Diagnosis Date Noted  . Insomnia 02/08/2016  . GAD (generalized anxiety disorder) 12/29/2014  . GERD (gastroesophageal reflux disease) 10/07/2014  . Chronic pain syndrome 10/07/2014  . Carpal tunnel syndrome 09/22/2014  .  Obstructive sleep apnea 09/22/2014  . Psoriasis 09/22/2014   JKerin Perna PTA 02/14/16 1:10 PM  [facility] [address]Phone: [phone]  Fax:  [phone] Name: CShantese RavenMRN: [id number] of Birth: 401/30/1957

## 2016-02-14 NOTE — Telephone Encounter (Signed)
PA submitted for [employer]

## 2016-02-17 ENCOUNTER — Ambulatory Visit (INDEPENDENT_AMBULATORY_CARE_PROVIDER_SITE_OTHER): Payer: PRIVATE HEALTH INSURANCE | Admitting: Physical Therapy

## 2016-02-17 DIAGNOSIS — R29898 Other symptoms and signs involving the musculoskeletal system: Secondary | ICD-10-CM | POA: Diagnosis not present

## 2016-02-17 DIAGNOSIS — M545 Low back pain, unspecified: Secondary | ICD-10-CM

## 2016-02-17 DIAGNOSIS — R293 Abnormal posture: Secondary | ICD-10-CM

## 2016-02-17 DIAGNOSIS — M542 Cervicalgia: Secondary | ICD-10-CM | POA: Diagnosis not present

## 2016-02-17 NOTE — Therapy (Addendum)
[place], [place], [address] Phone: [phone]   Fax:  [phone]  Physical Therapy Treatment  Patient Details  Name: Desiree Hughes MRN: [id number] Date of Birth: [birth year] Referring Provider: Dr. Madilyn Fireman  Encounter Date: 02/17/2016      PT End of Session - 02/17/16 1111    Visit Number 10   Number of Visits 12   Date for PT Re-Evaluation 02/23/16   PT Start Time 1104   PT Stop Time 7425   PT Time Calculation (min) 53 min   Activity Tolerance Patient tolerated treatment well;No increased pain   Behavior During Therapy WFL for tasks assessed/performed      Past Medical History:  Diagnosis Date  . Herniated disc, cervical   . Hypertension     Past Surgical History:  Procedure Laterality Date  . TUBAL LIGATION      There were no vitals filed for this visit.      Subjective Assessment - 02/17/16 1112    Subjective Pt reports she has had an increased "pinching" in low back since her fall on the trail 3 days ago. She has been more aware of postures to avoid that are painful. No headache today.  She continues to complain of 5/10 pain in back and neck with chores such as dishes and vacuuming and 7/10 pain with sitting for computer work.     Currently in Pain? Yes   Pain Score 3    Pain Location Back   Pain Orientation Lower  (R>L)   Pain Descriptors / Indicators --  pinching    Pain Score 1   Pain Location Neck   Pain Orientation Right   Pain Descriptors / Indicators Dull;Aching            [facility] PT Assessment - 02/17/16 0001      AROM   Cervical Flexion 52   Cervical Extension 46  with pain   Cervical - Right Side Bend 35  with pain   Cervical - Left Side Bend 31     Lumbar:  Flexion 65 degrees,  Extension 20 degrees, Right rotation 30 degrees, Left rotation 25 degrees.         [facility] Adult PT Treatment/Exercise - 02/17/16 0001      Lumbar Exercises: Stretches    Passive Hamstring Stretch 2 reps;30 seconds   Double Knee to Chest Stretch 2 reps;30 seconds   Lower Trunk Rotation 20 seconds;5 reps   Quadruped Mid Back Stretch 30 seconds;4 reps  childs pose     Lumbar Exercises: Aerobic   Stationary Bike NuStep (arms - 9 - /legs) L4: 6 min      Lumbar Exercises: Sidelying   Other Sidelying Lumbar Exercises Lt sidelying over green bolster with arm over head stretching Rt side.      Lumbar Exercises: Prone   Other Prone Lumbar Exercises pelvic press x 5 sec hold x 10 reps.  Pelvic press with unilateral knee flex x 5, bilat knee flex x 5, and 2 sets of hip ext (knee straight) x 5      Electrical Stimulation   Electrical Stimulation Location Rt lumbar paraspinals    Electrical Stimulation Action COMBO [place]    Electrical Stimulation Parameters to tolerance    Electrical Stimulation Goals Pain;Tone     Ultrasound   Ultrasound Location Rt lumbar paraspinals   Ultrasound Parameters 100%, 1.2 w/cm2, 8 min    Ultrasound Goals Pain  tone  Manual Therapy   Soft tissue mobilization to Rt QL/ lumbar paraspinals, (cross fiber and TPR)    Myofascial Release to Rt QL/ lumbar paraspinals.  gentle traction between Rt ribs and iliac crest x 3 reps of 30 sec.      Neck Exercises: Stretches   Upper Trapezius Stretch 20 seconds;2 reps   Other Neck Stretches 3 way doorway stretch 30 sec each - 2 sets   Other Neck Stretches modified down dog stretch x 2 reps x 20 sec.                 PT Education - 02/17/16 1129    Education provided Yes   Education Details HEP - added pelvic press    Person(s) Educated Patient   Methods Explanation;Handout;Tactile cues   Comprehension Verbalized understanding;Returned demonstration             PT Long Term Goals - 02/17/16 1150      PT LONG TERM GOAL #1   Title Patient to demonstrate upright posture with good alignment of head on body 02/23/16   Time 6   Period Weeks   Status Partially Met  req cues in  sitting for improved posture     PT LONG TERM GOAL #2   Title Cervical and lumbar ROM WFL's and pain free 02/23/16   Baseline good gains in cervical mobility noted - see ROM measurements   Time 6   Period Weeks   Status Partially Met     PT LONG TERM GOAL #3   Title Patient reports return to normal functional activities with minimal to no pain or discomfort 02/23/16   Time 6   Period Weeks   Status Partially Met     PT LONG TERM GOAL #4   Title decrease frequency, intensity, duration of headaches 02/23/16   Time 6   Period Weeks   Status Achieved     PT LONG TERM GOAL #5   Title Patient independent in HEP 02/23/16   Time 6   Period Weeks   Status On-going     PT LONG TERM GOAL #6   Title Improve FOTO to </= 43% limitation 02/23/16   Time 6   Period Weeks   Status Achieved  37% limited.                Plan - 02/17/16 1208    Clinical Impression Statement Pt tolerated new exercises well, reporting less low back discomfort after sidelying stretch.  Pt reported elimination of low back pain after manual and combo [place].   She has met her FOTO, scoring 37% limited on survey.  She has made good progress towards goals, yet would benefit from additional therapy visits to reduce pain and maximize functional mobility with ADLs.    Rehab Potential Good   PT Frequency 2x / week   PT Duration 6 weeks   PT Treatment/Interventions Patient/family education;ADLs/Self Care Home Management;Neuromuscular re-education;Cryotherapy;Electrical Stimulation;Iontophoresis 4m/ml Dexamethasone;Moist Heat;Traction;Ultrasound;Dry needling;Manual techniques;Therapeutic activities;Therapeutic exercise   PT Next Visit Plan Await approval of additional visits from insurance.  Continue progressive spinal stabilization for neck and low back.   Manual therapy and modalities as indicated.     Consulted and Agree with Plan of Care Patient      Patient will benefit from skilled therapeutic intervention in order  to improve the following deficits and impairments:  Postural dysfunction, Improper body mechanics, Pain, Decreased range of motion, Decreased mobility, Increased fascial restricitons, Increased muscle spasms, Decreased activity tolerance  Visit Diagnosis: Cervicalgia  Acute right-sided low back pain without sciatica  Other symptoms and signs involving the musculoskeletal system  Abnormal posture     Problem List Patient Active Problem List   Diagnosis Date Noted  . Insomnia 02/08/2016  . GAD (generalized anxiety disorder) 12/29/2014  . GERD (gastroesophageal reflux disease) 10/07/2014  . Chronic pain syndrome 10/07/2014  . Carpal tunnel syndrome 09/22/2014  . Obstructive sleep apnea 09/22/2014  . Psoriasis 09/22/2014   Kerin Perna, PTA 02/17/16 12:46 PM  [place], [place], [address] Phone: [phone]   Fax:  [phone]  Name: Exa Bomba MRN: [id number] Date of Birth: Feb 09, 1956

## 2016-02-17 NOTE — Patient Instructions (Signed)
Pelvic Press     Place hands under belly between navel and pubic bone, palms up. Feel pressure on hands. Increase pressure on hands by pressing pelvis down. This is NOT a pelvic tilt. Hold __5_ seconds. Relax. Repeat _10__ times. Once a day.  KNEE: Flexion - Prone   Hold pelvic press. Bend knee, then return the foot down. Repeat on opposite leg. Do not raise hips. _10__ reps per set. When this is mastered, pull both heels up at same time, x 10 reps.  Once a day   Leg Lift: One-Leg   Press pelvis down. Keep knee straight; lengthen and lift one leg (from waist). Do not twist body. Keep other leg down. Hold _1__ seconds. Relax. Repeat 10 time. Repeat with other leg.  HIP: Extension / KNEE: Flexion - Prone    Hold pelvic press. Bend knee, squeeze glutes. Raise leg up  10___ reps per set, _1__ sets per day, _1__ time a day.   Axial Extension- Upper body sequence * always start with pelvic press    Lie on stomach with forehead resting on floor and arms at sides. Tuck chin in and raise head from floor without bending it up or down. Repeat ___10_ times per set. Do __1__ sets per session. Do _1___ sessions per day.  Progression:  Arms at side Arms in T shape Arms in W shape  Arms in M shape Arms in Y shape  [facility] [address] [place], [place] [address]  [phone] (office) [phone] (fax)   

## 2016-02-24 NOTE — Telephone Encounter (Signed)
Call patient: We received notification from her insurance they will not cover the Methasone even after trying to get it authorized. They require that she try budesonide, flunisolide, or triamcinolone first.  Please see if she has a preference. If not then I will just pick 1.

## 2016-02-25 MED ORDER — BUDESONIDE 32 MCG/ACT NA SUSP: 2.0000 | Freq: Every day | NASAL | 99 refills | Status: DC

## 2016-02-25 NOTE — Telephone Encounter (Signed)
She doesn't have a preference

## 2016-02-25 NOTE — Telephone Encounter (Signed)
New rx sent

## 2016-03-01 ENCOUNTER — Ambulatory Visit (INDEPENDENT_AMBULATORY_CARE_PROVIDER_SITE_OTHER): Payer: PRIVATE HEALTH INSURANCE | Admitting: Physical Therapy

## 2016-03-01 DIAGNOSIS — M542 Cervicalgia: Secondary | ICD-10-CM

## 2016-03-01 DIAGNOSIS — M545 Low back pain, unspecified: Secondary | ICD-10-CM

## 2016-03-01 DIAGNOSIS — R29898 Other symptoms and signs involving the musculoskeletal system: Secondary | ICD-10-CM

## 2016-03-01 DIAGNOSIS — R293 Abnormal posture: Secondary | ICD-10-CM | POA: Diagnosis not present

## 2016-03-01 NOTE — Therapy (Signed)
[place], [place], [address] Phone: [phone]   Fax:  [phone]  Physical Therapy Treatment  Patient Details  Name: Desiree Hughes MRN: [id number] Date of Birth: 1955-12-04 Referring Provider: Dr. Madilyn Fireman  Encounter Date: 03/01/2016      PT End of Session - 03/01/16 1105    Visit Number 11   Number of Visits 12   Date for PT Re-Evaluation 02/23/16   PT Start Time 1102   PT Stop Time 1205   PT Time Calculation (min) 63 min      Past Medical History:  Diagnosis Date  . Herniated disc, cervical   . Hypertension     Past Surgical History:  Procedure Laterality Date  . TUBAL LIGATION      There were no vitals filed for this visit.      Subjective Assessment - 03/01/16 1105    Subjective Pt reports she has had less headaches.  Low back continues to be bothersome; vacuuming, cooking, and clean dishes is still painful.  She has been trying to walk more while dog is at the dog park. She continues to do HEP 2x/day.     Currently in Pain? Yes   Pain Score 3    Pain Location Back   Pain Orientation Lower  Rt>Lt   Pain Descriptors / Indicators --  pinching   Aggravating Factors  twisting, bending, prolonged standing   Pain Relieving Factors laying flat             [facility] PT Assessment - 03/01/16 0001      Assessment   Medical Diagnosis Cervical strain; migraines; LBP   Referring Provider Dr. Madilyn Fireman   Onset Date/Surgical Date 12/24/15   Hand Dominance Right     Observation/Other Assessments   Focus on Therapeutic Outcomes (FOTO)  37% limited on 02/17/16     AROM   Cervical - Right Side Bend 35   Cervical - Left Side Bend 32   Cervical - Right Rotation 60   Cervical - Left Rotation 60  with tightness   Lumbar Flexion 80   Lumbar Extension 20   Lumbar - Right Side Bend 27  with pinch   Lumbar - Left Side Bend 34   Lumbar - Right Rotation 34   Lumbar - Left Rotation 38           [facility] Adult PT Treatment/Exercise - 03/01/16 0001      Self-Care   Other Self-Care Comments  pt educated on self massage with ball in prone for Rt hip flexor release. pt returned demo.      Lumbar Exercises: Stretches   Lower Trunk Rotation 5 reps  with arm wringing, cervical rotation     Lumbar Exercises: Aerobic   Stationary Bike NuStep (arms - 9 - /legs) L4: 6 min      Lumbar Exercises: Standing   Other Standing Lumbar Exercises Lt lunge for Rt hip flexor stretch.   high kneeling Rt hip flexor stretch with Rt arm over head x 3 reps.      Lumbar Exercises: Sidelying   Other Sidelying Lumbar Exercises Lt sidelying over green bolster with arm over head stretching Rt side.      Lumbar Exercises: Prone   Other Prone Lumbar Exercises pelvic press x 5 sec hold x 10 reps.  then press with unilateral / bilat knee flex/ext.  then with hip ext      Theme park manager  Rt lumbar paraspinal/ QL   Electrical Stimulation Action combo [place]   Electrical Stimulation Parameters to tolerance    Electrical Stimulation Goals Pain;Tone     Ultrasound   Ultrasound Location Rt lumbar paraspinals   Ultrasound Parameters 100%, 1.2 w/cm2, 8 mi n   Ultrasound Goals Pain  tone     Manual Therapy   Soft tissue mobilization to Rt QL/ lumbar paraspinals, (cross fiber and TPR)    Myofascial Release to Rt QL/ lumbar paraspinals.  gentle traction between Rt ribs and iliac crest x 3 reps of 30 sec.   MFR to Rt psoas.      Neck Exercises: Stretches   Upper Trapezius Stretch 20 seconds;2 reps   Other Neck Stretches 3 way doorway stretch 30 sec each - 2 sets                     PT Long Term Goals - 03/01/16 1329      PT LONG TERM GOAL #1   Title Patient to demonstrate upright posture with good alignment of head on body 02/23/16   Time 6   Period Weeks   Status Partially Met     PT LONG TERM GOAL #2   Title Cervical and lumbar ROM WFL's and  pain free 02/23/16   Time 6   Period Weeks   Status Partially Met     PT LONG TERM GOAL #3   Title Patient reports return to normal functional activities with minimal to no pain or discomfort 02/23/16   Time 6   Period Weeks   Status Partially Met     PT LONG TERM GOAL #4   Title decrease frequency, intensity, duration of headaches 02/23/16   Time 6   Period Weeks   Status Achieved     PT LONG TERM GOAL #5   Title Patient independent in HEP 02/23/16   Time 6   Period Weeks   Status On-going     PT LONG TERM GOAL #6   Title Improve FOTO to </= 43% limitation 02/23/16   Time 6   Period Weeks   Status Achieved               Plan - 03/01/16 1320    Clinical Impression Statement Pt had increased pinching pain in Rt low back with supine trunk rotation Lt, Rt side bending, and prone Rt hip ext.  She was point tender with manual therapy to Rt iliopsoas.  She remains tight along Rt side affecting Lt cervical ROM and Rt lumbar ROM.   She reported decrease in symptoms at end of session.  She will benefit from continued therapy to decrease pain and improve functional mobility   Rehab Potential Good   PT Treatment/Interventions Patient/family education;ADLs/Self Care Home Management;Neuromuscular re-education;Cryotherapy;Electrical Stimulation;Iontophoresis 36m/ml Dexamethasone;Moist Heat;Traction;Ultrasound;Dry needling;Manual techniques;Therapeutic activities;Therapeutic exercise   PT Next Visit Plan Spoke to supervising PT regarding pt's progress and her desire to continue therapy.  Will req renewal.     PT Home Exercise Plan Pt to trial self massage to Rt back/ psoas with ball. Added Rt hip flexor stretch.    Consulted and Agree with Plan of Care Patient      Patient will benefit from skilled therapeutic intervention in order to improve the following deficits and impairments:  Postural dysfunction, Improper body mechanics, Pain, Decreased range of motion, Decreased mobility,  Increased fascial restricitons, Increased muscle spasms, Decreased activity tolerance  Visit Diagnosis: No diagnosis found.  Problem List Patient Active Problem List   Diagnosis Date Noted  . Insomnia 02/08/2016  . GAD (generalized anxiety disorder) 12/29/2014  . GERD (gastroesophageal reflux disease) 10/07/2014  . Chronic pain syndrome 10/07/2014  . Carpal tunnel syndrome 09/22/2014  . Obstructive sleep apnea 09/22/2014  . Psoriasis 09/22/2014   Kerin Perna, PTA 00/34/91 7:91 PM  Re-certification sent.  Cassell Clement, PT, CSCS     [facility] [place], [place], [address] Phone: [phone]   Fax:  [phone]  Name: Denina Rieger MRN: [id number] Date of Birth: Jan 30, 1956

## 2016-03-03 ENCOUNTER — Encounter: Payer: PRIVATE HEALTH INSURANCE | Admitting: Physical Therapy

## 2016-03-06 ENCOUNTER — Ambulatory Visit (INDEPENDENT_AMBULATORY_CARE_PROVIDER_SITE_OTHER): Payer: PRIVATE HEALTH INSURANCE | Admitting: Physical Therapy

## 2016-03-06 DIAGNOSIS — M545 Low back pain, unspecified: Secondary | ICD-10-CM

## 2016-03-06 DIAGNOSIS — R29898 Other symptoms and signs involving the musculoskeletal system: Secondary | ICD-10-CM | POA: Diagnosis not present

## 2016-03-06 DIAGNOSIS — M542 Cervicalgia: Secondary | ICD-10-CM | POA: Diagnosis not present

## 2016-03-06 DIAGNOSIS — R293 Abnormal posture: Secondary | ICD-10-CM | POA: Diagnosis not present

## 2016-03-06 NOTE — Therapy (Signed)
[facility] [place] [address] [place], [place], [address] Phone: [phone]   Fax:  [phone]  Physical Therapy Treatment  Patient Details  Name: Desiree Hughes MRN: [id number] Date of Birth: 29-Jan-1956 Referring Provider: Dr. Linford Arnold  Encounter Date: 03/06/2016      PT End of Session - 03/06/16 1017    Visit Number 12   Number of Visits 15   Date for PT Re-Evaluation 03/29/16   PT Start Time 1018   PT Stop Time 1104   PT Time Calculation (min) 46 min      Past Medical History:  Diagnosis Date  . Herniated disc, cervical   . Hypertension     Past Surgical History:  Procedure Laterality Date  . TUBAL LIGATION      There were no vitals filed for this visit.      Subjective Assessment - 03/06/16 1019    Subjective Pt reports she was tender in low back on Rt the evening of last session, into next day.  Rock tape lasted two days.  She tried the hip flexor stretch and ball massage since last visit.     Patient Stated Goals get rid of pain - no more headaches - being able to walk puppy without pain    Currently in Pain? Yes   Pain Score 0-No pain  up to 3/10 with bending/ twisting   Pain Location Back   Pain Orientation Lower            [facility] PT Assessment - 03/06/16 0001      Assessment   Medical Diagnosis Cervical strain; migraines; LBP   Referring Provider Dr. Linford Arnold   Onset Date/Surgical Date 12/24/15   Hand Dominance Right   Next MD Visit PRN          [facility] Adult PT Treatment/Exercise - 03/06/16 0001      Neck Exercises: Seated   Other Seated Exercise Seated on green therapy ball:  wood chop with yellow band x 10 reps each side.    Pelvic tilts ant/post, side/side, CW/CCW      Lumbar Exercises: Aerobic   Stationary Bike NuStep (arms - 9 - /legs) L4: 5 min      Lumbar Exercises: Standing   Other Standing Lumbar Exercises Lt lunge for Rt hip flexor stretch x 20 sec x 2 reps.   high  kneeling Rt hip flexor stretch with Rt arm over head x 3 reps.      Lumbar Exercises: Supine   Bridge 10 reps  feet on green ball    Other Supine Lumbar Exercises Rt hip flexor stretch with Left knee held at chest, RLE off table x 2 reps at 30 sec      Lumbar Exercises: Prone   Other Prone Lumbar Exercises pelvic press with hip ext (knee bent) x  5 reps each side.  2 sets   Other Prone Lumbar Exercises childs pose x 3 reps      Modalities   Modalities Electrical Stimulation;Ultrasound     Electrical Stimulation   Electrical Stimulation Location Rt lumbar paraspinals    Electrical Stimulation Action combo [place]   Electrical Stimulation Parameters to tolerance    Electrical Stimulation Goals Pain;Tone     Ultrasound   Ultrasound Location Rt lumbar paraspinals    Ultrasound Parameters 100%, 1.2 w/cm2, 8 min    Ultrasound Goals Pain     Neck Exercises: Stretches   Upper Trapezius Stretch 20 seconds;2 reps   Other Neck  Stretches 3 way doorway stretch 30 sec each - 2 sets                     PT Long Term Goals - 03/01/16 1329      PT LONG TERM GOAL #1   Title Patient to demonstrate upright posture with good alignment of head on body 03/29/16   Time 6   Period Weeks   Status Partially Met     PT LONG TERM GOAL #2   Title Cervical and lumbar ROM WFL's and pain free 03/29/16   Time 6   Period Weeks   Status Partially Met     PT LONG TERM GOAL #3   Title Patient reports return to normal functional activities with minimal to no pain or discomfort 03/29/16   Time 6   Period Weeks   Status Partially Met     PT LONG TERM GOAL #4   Title decrease frequency, intensity, duration of headaches 03/29/16   Time 6   Period Weeks   Status Achieved     PT LONG TERM GOAL #5   Title Patient independent in HEP 03/29/16   Time 6   Period Weeks   Status On-going     PT LONG TERM GOAL #6   Title Improve FOTO to </= 43% limitation 03/29/16   Time 6   Period Weeks   Status  Achieved               Plan - 03/06/16 1308    Clinical Impression Statement Pt tolerated all new exercises well, with minimal to no increase in pain.  Pt did require some tactile cues to improve form.  Pain level is slightly improved with functional activities.     Rehab Potential Good   PT Frequency 2x / week   PT Duration 6 weeks   PT Treatment/Interventions Patient/family education;ADLs/Self Care Home Management;Neuromuscular re-education;Cryotherapy;Electrical Stimulation;Iontophoresis '4mg'$ /ml Dexamethasone;Moist Heat;Traction;Ultrasound;Dry needling;Manual techniques;Therapeutic activities;Therapeutic exercise   PT Next Visit Plan Assess response to new exercises, add wood chop to HEP.    Consulted and Agree with Plan of Care Patient      Patient will benefit from skilled therapeutic intervention in order to improve the following deficits and impairments:  Postural dysfunction, Improper body mechanics, Pain, Decreased range of motion, Decreased mobility, Increased fascial restricitons, Increased muscle spasms, Decreased activity tolerance  Visit Diagnosis: Cervicalgia  Acute right-sided low back pain without sciatica  Other symptoms and signs involving the musculoskeletal system  Abnormal posture     Problem List Patient Active Problem List   Diagnosis Date Noted  . Insomnia 02/08/2016  . GAD (generalized anxiety disorder) 12/29/2014  . GERD (gastroesophageal reflux disease) 10/07/2014  . Chronic pain syndrome 10/07/2014  . Carpal tunnel syndrome 09/22/2014  . Obstructive sleep apnea 09/22/2014  . Psoriasis 09/22/2014   Kerin Perna, PTA 03/06/16 1:13 PM  [facility] [place], [place], [address] Phone: [phone]   Fax:  [phone]  Name: Diarra Ceja MRN: [id number] Date of Birth: [birth year]

## 2016-03-09 ENCOUNTER — Encounter: Payer: PRIVATE HEALTH INSURANCE | Admitting: Physical Therapy

## 2016-03-13 ENCOUNTER — Ambulatory Visit (INDEPENDENT_AMBULATORY_CARE_PROVIDER_SITE_OTHER): Payer: PRIVATE HEALTH INSURANCE | Admitting: Rehabilitative and Restorative Service Providers"

## 2016-03-13 ENCOUNTER — Encounter: Payer: Self-pay | Admitting: Rehabilitative and Restorative Service Providers"

## 2016-03-13 DIAGNOSIS — M545 Low back pain, unspecified: Secondary | ICD-10-CM

## 2016-03-13 DIAGNOSIS — R29898 Other symptoms and signs involving the musculoskeletal system: Secondary | ICD-10-CM | POA: Diagnosis not present

## 2016-03-13 DIAGNOSIS — R293 Abnormal posture: Secondary | ICD-10-CM

## 2016-03-13 DIAGNOSIS — M542 Cervicalgia: Secondary | ICD-10-CM | POA: Diagnosis not present

## 2016-03-13 NOTE — Therapy (Signed)
[place], [place], [address] Phone: [phone]   Fax:  [phone]  Physical Therapy Treatment  Patient Details  Name: Desiree Hughes MRN: [id number] Date of Birth: 1956/02/08 Referring Provider: Dr. Madilyn Fireman  Encounter Date: 03/13/2016      PT End of Session - 03/13/16 1107    Visit Number 13   Number of Visits 15   Date for PT Re-Evaluation 03/29/16   PT Start Time 4270   PT Stop Time 1157   PT Time Calculation (min) 55 min   Activity Tolerance Patient tolerated treatment well      Past Medical History:  Diagnosis Date  . Herniated disc, cervical   . Hypertension     Past Surgical History:  Procedure Laterality Date  . TUBAL LIGATION      There were no vitals filed for this visit.      Subjective Assessment - 03/13/16 1107    Subjective Cotninued symptoms in LB but improving. She has a sore spot in the Lt neck area today - not sure what that is from. Trying to be more active. Walking more and more active at home.  Tried to lift the foot rest yesterday to get a dog toy and that may have irritated things.   Currently in Pain? Yes   Pain Score 4    Pain Orientation Lower   Pain Descriptors / Indicators Tender  pinching   Pain Type Acute pain   Pain Onset More than a month ago   Pain Score 3   Pain Location Neck   Pain Orientation Right   Pain Descriptors / Indicators Tender   Pain Type Acute pain   Pain Onset More than a month ago   Pain Frequency Intermittent   Pain Score 1   Pain Location Head   Pain Orientation Right;Left   Pain Descriptors / Indicators Aching   Pain Type Acute pain   Pain Onset More than a month ago   Pain Frequency Intermittent                         [facility] Adult PT Treatment/Exercise - 03/13/16 0001      Neck Exercises: Supine   Neck Retraction 10 reps;10 secs     Lumbar Exercises: Aerobic   Stationary Bike NuStep (arms - 9 -  /legs) L4: 5 min      Lumbar Exercises: Supine   Bridge 10 reps  feet on green ball    Other Supine Lumbar Exercises Rt hip flexor stretch with Left knee held at chest, RLE off table x 2 reps at 30 sec    Other Supine Lumbar Exercises 3 part core 10 sec x 10     Moist Heat Therapy   Number Minutes Moist Heat 20 Minutes   Moist Heat Location Cervical;Lumbar Spine     Electrical Stimulation   Electrical Stimulation Location Rt lumbar paraspinals    Electrical Stimulation Parameters to tolerance   Electrical Stimulation Goals Pain;Tone     Neck Exercises: Stretches   Upper Trapezius Stretch 20 seconds;2 reps   Other Neck Stretches 3 way doorway stretch 30 sec each - 2 sets                     PT Long Term Goals - 03/01/16 1329      PT LONG TERM GOAL #1   Title Patient to demonstrate upright posture with good alignment  of head on body 03/29/16   Time 6   Period Weeks   Status Partially Met     PT LONG TERM GOAL #2   Title Cervical and lumbar ROM WFL's and pain free 03/29/16   Time 6   Period Weeks   Status Partially Met     PT LONG TERM GOAL #3   Title Patient reports return to normal functional activities with minimal to no pain or discomfort 03/29/16   Time 6   Period Weeks   Status Partially Met     PT LONG TERM GOAL #4   Title decrease frequency, intensity, duration of headaches 03/29/16   Time 6   Period Weeks   Status Achieved     PT LONG TERM GOAL #5   Title Patient independent in HEP 03/29/16   Time 6   Period Weeks   Status On-going     PT LONG TERM GOAL #6   Title Improve FOTO to </= 43% limitation 03/29/16   Time 6   Period Weeks   Status Achieved               Plan - 03/13/16 1253    Clinical Impression Statement Performing exercises and increasing activity level at home. Flare up of cervical tightness and discomfort/pain as well as headache in the past 1-2 days. Patient respondd well to manual work and passive stretch for cervical  musculature.    Rehab Potential Good   PT Frequency 2x / week   PT Duration 6 weeks   PT Treatment/Interventions Patient/family education;ADLs/Self Care Home Management;Neuromuscular re-education;Cryotherapy;Electrical Stimulation;Iontophoresis '4mg'$ /ml Dexamethasone;Moist Heat;Traction;Ultrasound;Dry needling;Manual techniques;Therapeutic activities;Therapeutic exercise   PT Next Visit Plan Assess response to cervical work; add exercise as indicated inc  wood chop to HEP.    Consulted and Agree with Plan of Care Patient      Patient will benefit from skilled therapeutic intervention in order to improve the following deficits and impairments:  Postural dysfunction, Improper body mechanics, Pain, Decreased range of motion, Decreased mobility, Increased fascial restricitons, Increased muscle spasms, Decreased activity tolerance  Visit Diagnosis: Cervicalgia  Acute right-sided low back pain without sciatica  Other symptoms and signs involving the musculoskeletal system  Abnormal posture     Problem List Patient Active Problem List   Diagnosis Date Noted  . Insomnia 02/08/2016  . GAD (generalized anxiety disorder) 12/29/2014  . GERD (gastroesophageal reflux disease) 10/07/2014  . Chronic pain syndrome 10/07/2014  . Carpal tunnel syndrome 09/22/2014  . Obstructive sleep apnea 09/22/2014  . Psoriasis 09/22/2014    Winthrop Shannahan Nilda Simmer PT, MPH  03/13/2016, 12:57 PM  [facility] [place], [place], [address] Phone: [phone]   Fax:  [phone]  Name: Lizanne Erker MRN: [id number] Date of Birth: 08-Sep-1955

## 2016-03-20 ENCOUNTER — Ambulatory Visit (INDEPENDENT_AMBULATORY_CARE_PROVIDER_SITE_OTHER): Payer: PRIVATE HEALTH INSURANCE | Admitting: Physical Therapy

## 2016-03-20 DIAGNOSIS — M545 Low back pain, unspecified: Secondary | ICD-10-CM

## 2016-03-20 DIAGNOSIS — M542 Cervicalgia: Secondary | ICD-10-CM

## 2016-03-20 DIAGNOSIS — R293 Abnormal posture: Secondary | ICD-10-CM | POA: Diagnosis not present

## 2016-03-20 DIAGNOSIS — R29898 Other symptoms and signs involving the musculoskeletal system: Secondary | ICD-10-CM | POA: Diagnosis not present

## 2016-03-20 NOTE — Patient Instructions (Signed)
Wood Chop: Sitting (Cable)    Grasp cable and rotate trunk by bringing hands toward opposite hip. Keep pelvis stable. Repeat to other side. Do __2__ sets. Complete __10__ repetitions.   X 10 times each side, holding 1-2 seconds each rep.    [facility] [address] [place] [address] [place], [place] [address]  [phone] (office) [phone] (fax)

## 2016-03-20 NOTE — Therapy (Signed)
[facility] [place], [place], [address] Phone: [phone]   Fax:  [phone]  Physical Therapy Treatment  Patient Details  Name: Desiree Hughes MRN: [id number] Date of Birth: 05-11-1955 Referring Provider: Dr. Madilyn Hughes  Encounter Date: 03/20/2016      PT End of Session - 03/20/16 0943    Visit Number 14   Number of Visits 15   Date for PT Re-Evaluation 03/29/16   PT Start Time 0938   PT Stop Time 1029   PT Time Calculation (min) 51 min   Activity Tolerance Patient tolerated treatment well   Behavior During Therapy Desiree Hughes for tasks assessed/performed      Past Medical History:  Diagnosis Date  . Herniated disc, cervical   . Hypertension     Past Surgical History:  Procedure Laterality Date  . TUBAL LIGATION      There were no vitals filed for this visit.      Subjective Assessment - 03/20/16 0940    Subjective Pt reports that as long as she doesn't bend or twist wrong, she is ok.  She has some tightness with standing / doing dishes.  She has been trying to be very aware and correct her posture.   She has used stretches and muscle relaxers to help ease pain.     Patient Stated Goals get rid of pain - no more headaches - being able to walk puppy without pain    Currently in Pain? No/denies  when reaching wrong - 6/10.    Pain Frequency Intermittent            [facility] PT Assessment - 03/20/16 0001      Assessment   Medical Diagnosis Cervical strain; migraines; LBP   Referring Provider Dr. Madilyn Hughes   Onset Date/Surgical Date 12/24/15   Hand Dominance Right   Next MD Visit PRN   Prior Therapy 1 year ago - for migraines     AROM   Cervical - Right Side Bend 35   Cervical - Left Side Bend 32   Lumbar Flexion 75   Lumbar Extension 22   Lumbar - Right Side Bend 37   Lumbar - Left Side Bend 41   Lumbar - Right Rotation 40   Lumbar - Left Rotation 40                     [facility]  Adult PT Treatment/Exercise - 03/20/16 0001      Neck Exercises: Machines for Strengthening   UBE (Upper Arm Bike) L3: 2 min forward/ 2 min backward (standing)     Neck Exercises: Seated   Other Seated Exercise Seated on stool:  wood chop with red band x 10 reps each side, 2 sets.     Lumbar Exercises: Stretches   Passive Hamstring Stretch 2 reps;30 seconds     Lumbar Exercises: Standing   Other Standing Lumbar Exercises Anti-rotation exercise (band out /in at waist level with red band attached to door) x 10 reps each side.      Lumbar Exercises: Prone   Other Prone Lumbar Exercises childs pose x 3 reps      Lumbar Exercises: Quadruped   Opposite Arm/Leg Raise Right arm/Left leg;Left arm/Right leg;5 seconds;5 reps     Modalities   Modalities Electrical Stimulation;Moist Heat     Moist Heat Therapy   Number Minutes Moist Heat 15 Minutes   Moist Heat Location Lumbar Spine;Cervical     Electrical Stimulation  Electrical Stimulation Location Rt cervical and lumbar paraspinals   Electrical Stimulation Action premod to each area   Electrical Stimulation Parameters to tolerance   Electrical Stimulation Goals Tone;Pain     Neck Exercises: Stretches   Upper Trapezius Stretch 20 seconds;2 reps   Other Neck Stretches 3 way doorway stretch 30 sec each - 2 sets                     PT Long Term Goals - 03/20/16 0957      PT LONG TERM GOAL #1   Title Patient to demonstrate upright posture with good alignment of head on body 03/29/16   Time 6   Period Weeks   Status Partially Met     PT LONG TERM GOAL #2   Title Cervical and lumbar ROM WFL's and pain free 03/29/16   Time 6   Period Weeks   Status Partially Met     PT LONG TERM GOAL #3   Title Patient reports return to normal functional activities with minimal to no pain or discomfort 03/29/16   Time 6   Period Weeks   Status Partially Met     PT LONG TERM GOAL #4   Title decrease frequency, intensity, duration  of headaches 03/29/16   Time 6   Period Weeks   Status Achieved     PT LONG TERM GOAL #5   Title Patient independent in HEP 03/29/16   Time 6   Period Weeks   Status On-going     PT LONG TERM GOAL #6   Title Improve FOTO to </= 43% limitation 03/29/16   Time 6   Period Weeks   Status Achieved               Plan - 03/20/16 1020    Clinical Impression Statement Pt demonstrated improved lumbar mobility.  Cervical lateral flexion still limited, but pain flare has resolved since last visit.  She tolerated exercises well without any increase in pain.  Pt is progressing well towards unmet goals.    Rehab Potential Good   PT Frequency 2x / week   PT Duration 6 weeks   PT Treatment/Interventions Patient/family education;ADLs/Self Care Home Management;Neuromuscular re-education;Cryotherapy;Electrical Stimulation;Iontophoresis 36m/ml Dexamethasone;Moist Heat;Traction;Ultrasound;Dry needling;Manual techniques;Therapeutic activities;Therapeutic exercise   PT Next Visit Plan Assess goals.  End of POC.    Consulted and Agree with Plan of Care Patient      Patient will benefit from skilled therapeutic intervention in order to improve the following deficits and impairments:  Postural dysfunction, Improper body mechanics, Pain, Decreased range of motion, Decreased mobility, Increased fascial restricitons, Increased muscle spasms, Decreased activity tolerance  Visit Diagnosis: Acute right-sided low back pain without sciatica  Cervicalgia  Other symptoms and signs involving the musculoskeletal system  Abnormal posture     Problem List Patient Active Problem List   Diagnosis Date Noted  . Insomnia 02/08/2016  . GAD (generalized anxiety disorder) 12/29/2014  . GERD (gastroesophageal reflux disease) 10/07/2014  . Chronic pain syndrome 10/07/2014  . Carpal tunnel syndrome 09/22/2014  . Obstructive sleep apnea 09/22/2014  . Psoriasis 09/22/2014    JKerin Hughes  PTA 03/20/16 1:01 PM  [place] [address]Phone: [phone]  Fax:  [phone] Name: CKaylina CahueMRN: [id number] of Birth: [birth year]

## 2016-03-27 ENCOUNTER — Encounter: Payer: Self-pay | Admitting: Rehabilitative and Restorative Service Providers"

## 2016-03-27 ENCOUNTER — Ambulatory Visit (INDEPENDENT_AMBULATORY_CARE_PROVIDER_SITE_OTHER): Payer: PRIVATE HEALTH INSURANCE | Admitting: Rehabilitative and Restorative Service Providers"

## 2016-03-27 DIAGNOSIS — R29898 Other symptoms and signs involving the musculoskeletal system: Secondary | ICD-10-CM | POA: Diagnosis not present

## 2016-03-27 DIAGNOSIS — R293 Abnormal posture: Secondary | ICD-10-CM | POA: Diagnosis not present

## 2016-03-27 DIAGNOSIS — M545 Low back pain, unspecified: Secondary | ICD-10-CM

## 2016-03-27 DIAGNOSIS — M542 Cervicalgia: Secondary | ICD-10-CM

## 2016-03-27 NOTE — Therapy (Addendum)
[place], [place], [address] Phone: [phone]   Fax:  [phone]  Physical Therapy Treatment  Patient Details  Name: Desiree Hughes MRN: [id number] Date of Birth: 1956/01/12 Referring Provider: Dr Madilyn Fireman  Encounter Date: 03/27/2016      PT End of Session - 03/27/16 0939    Visit Number 15   Number of Visits 15   Date for PT Re-Evaluation 03/29/16   PT Start Time 0932   PT Stop Time 1025   PT Time Calculation (min) 53 min   Activity Tolerance Patient tolerated treatment well      Past Medical History:  Diagnosis Date  . Herniated disc, cervical   . Hypertension     Past Surgical History:  Procedure Laterality Date  . TUBAL LIGATION      There were no vitals filed for this visit.      Subjective Assessment - 03/27/16 0940    Subjective Still having intermittent pain. Used leave blower for yesterday and "really felt it" afterwards. Walking the dog on a daily basis. She has returned to most daily activities. She continues to have difficulty with prolonged standing and the weather increases the discomfort.    Currently in Pain? Yes   Pain Score 4    Pain Location Back   Pain Orientation Lower   Pain Type Acute pain   Pain Onset More than a month ago   Pain Frequency Intermittent   Aggravating Factors  twisting; bending; prolonged standing    Pain Relieving Factors lying flat    Pain Score 2   Pain Location Neck   Pain Orientation Right   Pain Descriptors / Indicators Tingling;Tender   Pain Type Acute pain   Pain Frequency Intermittent   Pain Score 2   Pain Location Head   Pain Orientation Right;Left   Pain Descriptors / Indicators Aching            [facility] PT Assessment - 03/27/16 0001      Assessment   Medical Diagnosis Cervical strain; migraines; LBP   Referring Provider Dr Madilyn Fireman   Onset Date/Surgical Date 12/24/15   Hand Dominance Right   Next MD Visit PRN      Observation/Other Assessments   Focus on Therapeutic Outcomes (FOTO)  41% limitation     AROM   Cervical Flexion 42   Cervical Extension 54   Cervical - Right Side Bend 38   Cervical - Left Side Bend 35   Cervical - Right Rotation 63   Cervical - Left Rotation 64   Lumbar Flexion 75%   Lumbar Extension 55%   Lumbar - Right Side Bend 65%   Lumbar - Left Side Bend 70%   Lumbar - Right Rotation 50%   Lumbar - Left Rotation 60%     Strength   Overall Strength Comments 5/5 bilat U/LE's      Flexibility   Hamstrings tight Rt 85 deg; Lt 90 deg    Quadriceps WFL's some tightness Rt    ITB WFL's    Piriformis WFL's      Palpation   Palpation comment improved but continued tenderness and tightness through the Rt lumbar paraspinals and QL as well as the bialt occipital and cervical paraspinals/upper trap/pecs - cervical tighter Rt - upper trap tighter Lt                      [facility] Adult PT Treatment/Exercise - 03/27/16 0001  Self-Care   Other Self-Care Comments  encouraged continued consistent HEP and gradual increase in functional activites      Neck Exercises: Supine   Neck Retraction 10 reps;10 secs     Lumbar Exercises: Supine   Bridge 10 reps  feet on green therapy ball    Other Supine Lumbar Exercises Rt hip flexor stretch with Left knee held at chest, RLE off table x 2 reps at 30 sec    Other Supine Lumbar Exercises 3 part core 10 sec x 10     Lumbar Exercises: Prone   Other Prone Lumbar Exercises pelvic press with hip ext (knee bent) x  5 reps each side.  2 sets   Other Prone Lumbar Exercises childs pose x 3 reps      Lumbar Exercises: Quadruped   Opposite Arm/Leg Raise Right arm/Left leg;Left arm/Right leg;5 seconds;5 reps     Moist Heat Therapy   Number Minutes Moist Heat 15 Minutes   Moist Heat Location Lumbar Spine;Cervical     Electrical Stimulation   Electrical Stimulation Location Rt cervical and lumbar paraspinals   Electrical  Stimulation Action premod   Electrical Stimulation Parameters to tolerance   Electrical Stimulation Goals Tone;Pain     Manual Therapy   Manual therapy comments pt prone    Joint Mobilization CPA Grade II and III mobs lumbar spine    Soft tissue mobilization to Rt QL/ lumbar paraspinals, (cross fiber and TPR)    Myofascial Release to Rt QL/ lumbar paraspinals                     PT Long Term Goals - 03/27/16 1013      PT LONG TERM GOAL #1   Title Patient to demonstrate upright posture with good alignment of head on body 03/29/16   Time 6   Period Weeks   Status Partially Met     PT LONG TERM GOAL #2   Title Cervical and lumbar ROM WFL's and pain free 03/29/16   Baseline good gains in cervical mobility noted - see ROM measurements   Time 6   Period Weeks   Status Partially Met     PT LONG TERM GOAL #3   Title Patient reports return to normal functional activities with minimal to no pain or discomfort 03/29/16   Time 6   Period Weeks   Status Partially Met     PT LONG TERM GOAL #4   Title decrease frequency, intensity, duration of headaches 03/29/16   Baseline decreased c/o headaches and headaches she has are more "mild"   Time 6   Period Weeks   Status Achieved     PT LONG TERM GOAL #5   Title Patient independent in HEP 03/29/16   Time 6   Period Weeks   Status Achieved     PT LONG TERM GOAL #6   Title Improve FOTO to </= 43% limitation 03/29/16   Time 6   Period Weeks   Status Partially Met               Plan - 03/27/16 1316    Clinical Impression Statement Cicily reposrt continued episodic flare up of symtpoms when she "over does it" with acitvities that she normally did without difficulty. She demonstrates improved cervical and lumbar mobility and ROM with less palpable tightness noted through the cervical and lumbar regions. Sh ehas fewer migraine headaches and when she does have headaches they are less intense and duration is  shorter. Palyn  continues to have limitations functionally and needs to continue with her HEP to maximize her rehab potential. She will continue wit han independent HEP and contact us if she requires additional therapy.    Rehab Potential Good   PT Frequency 2x / week   PT Duration 6 weeks   PT Treatment/Interventions Patient/family education;ADLs/Self Care Home Management;Neuromuscular re-education;Cryotherapy;Electrical Stimulation;Iontophoresis '4mg'$ /ml Dexamethasone;Moist Heat;Traction;Ultrasound;Dry needling;Manual techniques;Therapeutic activities;Therapeutic exercise   PT Next Visit Plan Hold PT - patient to continue with independent HEP and call with any questions or problems    Consulted and Agree with Plan of Care Patient      Patient will benefit from skilled therapeutic intervention in order to improve the following deficits and impairments:  Postural dysfunction, Improper body mechanics, Pain, Decreased range of motion, Decreased mobility, Increased fascial restricitons, Increased muscle spasms, Decreased activity tolerance  Visit Diagnosis: Acute right-sided low back pain without sciatica  Cervicalgia  Other symptoms and signs involving the musculoskeletal system  Abnormal posture     Problem List Patient Active Problem List   Diagnosis Date Noted  . Insomnia 02/08/2016  . GAD (generalized anxiety disorder) 12/29/2014  . GERD (gastroesophageal reflux disease) 10/07/2014  . Chronic pain syndrome 10/07/2014  . Carpal tunnel syndrome 09/22/2014  . Obstructive sleep apnea 09/22/2014  . Psoriasis 09/22/2014    Taym Twist Nilda Simmer PT, MPH 03/27/2016, 1:55 PM  [facility] [place], [place], [address] Phone: [phone]   Fax:  [phone]  Name: Lynlee Stratton MRN: [id number] Date of Birth: 04-24-1956  PHYSICAL THERAPY DISCHARGE SUMMARY  Visits from Start of Care: 15  Current functional level related to goals /  functional outcomes: Good progress with rehab and HEP - see last PT progress note for discharge status   Remaining deficits: Intermittent pian and headaches   Education / Equipment: HEP; TENS unit  Plan: Patient agrees to discharge.  Patient goals were partially met. Patient is being discharged due to being pleased with the current functional level.  ?????    Leshea Jaggers P. Helene Kelp PT, MPH 04/19/16 9:30 AM

## 2016-05-25 ENCOUNTER — Other Ambulatory Visit: Payer: Self-pay | Admitting: Family Medicine

## 2016-09-14 ENCOUNTER — Ambulatory Visit (INDEPENDENT_AMBULATORY_CARE_PROVIDER_SITE_OTHER): Payer: PRIVATE HEALTH INSURANCE | Admitting: Family Medicine

## 2016-09-14 ENCOUNTER — Encounter: Payer: Self-pay | Admitting: Family Medicine

## 2016-09-14 VITALS — BP 130/72 | HR 75 | Wt 151.0 lb

## 2016-09-14 DIAGNOSIS — M25511 Pain in right shoulder: Secondary | ICD-10-CM

## 2016-09-14 DIAGNOSIS — W57XXXA Bitten or stung by nonvenomous insect and other nonvenomous arthropods, initial encounter: Secondary | ICD-10-CM

## 2016-09-14 DIAGNOSIS — S161XXA Strain of muscle, fascia and tendon at neck level, initial encounter: Secondary | ICD-10-CM

## 2016-09-14 MED ORDER — CYCLOBENZAPRINE HCL 10 MG PO TABS: ORAL_TABLET | ORAL | 0 refills | Status: DC

## 2016-09-14 MED ORDER — MELOXICAM 15 MG PO TABS: 15.0000 mg | ORAL_TABLET | Freq: Every day | ORAL | 1 refills | Status: DC

## 2016-09-14 MED ORDER — TRAZODONE HCL 50 MG PO TABS: 150.0000 mg | ORAL_TABLET | Freq: Every day | ORAL | 11 refills | Status: DC

## 2016-09-14 NOTE — Patient Instructions (Addendum)
Tick Bite Information Introduction Ticks are insects that attach themselves to the skin. There are many types of ticks. Common types include wood ticks and deer ticks. Sometimes, ticks carry diseases that can make a person very ill. The most common places for ticks to attach themselves are the scalp, neck, armpits, waist, and groin. HOW CAN YOU PREVENT TICK BITES? Take these steps to help prevent tick bites when you are outdoors:  Wear long sleeves and long pants.  Wear white clothes so you can see ticks more easily.  Tuck your pant legs into your socks.  If walking on a trail, stay in the middle of the trail to avoid brushing against bushes.  Avoid walking through areas with long grass.  Put bug spray on all skin that is showing and along boot tops, pant legs, and sleeve cuffs.  Check clothes, hair, and skin often and before going inside.  Brush off any ticks that are not attached.  Take a shower or bath as soon as possible after being outdoors. HOW SHOULD YOU REMOVE A TICK? Ticks should be removed as soon as possible to help prevent diseases. 1. If latex gloves are available, put them on before trying to remove a tick. 2. Use tweezers to grasp the tick as close to the skin as possible. You may also use curved forceps or a tick removal tool. Grasp the tick as close to its head as possible. Avoid grasping the tick on its body. 3. Pull gently upward until the tick lets go. Do not twist the tick or jerk it suddenly. This may break off the tick's head or mouth parts. 4. Do not squeeze or crush the tick's body. This could force disease-carrying fluids from the tick into your body. 5. After the tick is removed, wash the bite area and your hands with soap and water or alcohol. 6. Apply a small amount of antiseptic cream or ointment to the bite site. 7. Wash any tools that were used. Do not try to remove a tick by applying a hot match, petroleum jelly, or fingernail polish to the tick. These  methods do not work. They may also increase the chances of disease being spread from the tick bite. WHEN SHOULD YOU SEEK HELP? Contact your health care provider if you are unable to remove a tick or if a part of the tick breaks off in the skin. After a tick bite, you need to watch for signs and symptoms of diseases that can be spread by ticks. Contact your health care provider if you develop any of the following:  Fever.  Rash.  Redness and puffiness (swelling) in the area of the tick bite.  Tender, puffy lymph glands.  Watery poop (diarrhea).  Weight loss.  Cough.  Feeling more tired than normal (fatigue).  Muscle, joint, or bone pain.  Belly (abdominal) pain.  Headache.  Change in your level of consciousness.  Trouble walking or moving your legs.  Loss of feeling (numbness) in the legs.  Loss of movement (paralysis).  Shortness of breath.  Confusion.  Throwing up (vomiting) many times. This information is not intended to replace advice given to you by your health care provider. Make sure you discuss any questions you have with your health care provider. Document Released: 07/05/2009 Document Revised: 09/16/2015 Document Reviewed: 09/18/2012 Elsevier Interactive Patient Education  2017 Elsevier Inc.  
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
## 2017-03-06 ENCOUNTER — Telehealth: Payer: Self-pay | Admitting: Family Medicine

## 2017-03-06 NOTE — Telephone Encounter (Signed)
Called pt and informed her that she is overdue for a f/u visit and that she will need to schedule this before I can refill her medications. I transferred her to scheduling to do this. [facility].Loralee PacasBarkley, Hatim Homann [place]

## 2017-03-06 NOTE — Telephone Encounter (Signed)
Patient has requested that her primary pharmacy be changed to [facility] in order to receive a 90 day supply of her meds instead of 30 day supply. This is more affordable for her. Her prescriptions for cyclobenzaprine, fluticasone propionate, meloxicam, and trazodone are due soon. She would like these meds to go to [facility] from now on. Thanks!

## 2017-03-08 ENCOUNTER — Ambulatory Visit: Payer: PRIVATE HEALTH INSURANCE

## 2017-03-09 ENCOUNTER — Ambulatory Visit (INDEPENDENT_AMBULATORY_CARE_PROVIDER_SITE_OTHER): Payer: Managed Care, Other (non HMO) | Admitting: Family Medicine

## 2017-03-09 ENCOUNTER — Encounter: Payer: Self-pay | Admitting: Family Medicine

## 2017-03-09 ENCOUNTER — Other Ambulatory Visit (HOSPITAL_COMMUNITY)
Admission: RE | Admit: 2017-03-09 | Discharge: 2017-03-09 | Disposition: A | Discharge status: Home / Self Care | Payer: Managed Care, Other (non HMO) | Source: Ambulatory Visit | Attending: Family Medicine | Admitting: Family Medicine

## 2017-03-09 VITALS — BP 114/64 | HR 72 | Ht 66.0 in | Wt 149.0 lb

## 2017-03-09 DIAGNOSIS — M2550 Pain in unspecified joint: Secondary | ICD-10-CM | POA: Diagnosis not present

## 2017-03-09 DIAGNOSIS — Z0000 Encounter for general adult medical examination without abnormal findings: Secondary | ICD-10-CM

## 2017-03-09 DIAGNOSIS — M2540 Effusion, unspecified joint: Secondary | ICD-10-CM

## 2017-03-09 DIAGNOSIS — Z124 Encounter for screening for malignant neoplasm of cervix: Secondary | ICD-10-CM

## 2017-03-09 DIAGNOSIS — Z1231 Encounter for screening mammogram for malignant neoplasm of breast: Secondary | ICD-10-CM

## 2017-03-09 DIAGNOSIS — Z23 Encounter for immunization: Secondary | ICD-10-CM | POA: Diagnosis not present

## 2017-03-09 MED ORDER — MELOXICAM 15 MG PO TABS: 15.0000 mg | ORAL_TABLET | Freq: Every day | ORAL | 1 refills | Status: DC

## 2017-03-09 MED ORDER — TRAZODONE HCL 50 MG PO TABS: 150.0000 mg | ORAL_TABLET | Freq: Every day | ORAL | 3 refills | Status: DC

## 2017-03-09 MED ORDER — FLUTICASONE PROPIONATE 50 MCG/ACT NA SUSP: 1.0000 | Freq: Every day | NASAL | 3 refills | Status: DC

## 2017-03-09 NOTE — Patient Instructions (Signed)
Check to see if your insurance will cover cryotherapy for seborrheic keratoses and cryotherapy for corn/callus.

## 2017-03-09 NOTE — Progress Notes (Signed)
Subjective:     Desiree BushmanCindy Moritz is a 61 y.o. female and is here for a comprehensive physical exam. The patient reports no problems.  She is been under some increased stress lately.  Her diabetic son has come to live with them.  He is working part-time but had a hypoglycemic event where he was unresponsive and that has created a lot of anxiety for her.  She also feels like her arthritis has been flaring.  She is never seen rheumatology or been formally diagnosed with psoriatic arthritis.  Like she is been getting some swelling and a lot of stiffness in her joints particularly her hands and knees and right shoulder.  Social History   Socioeconomic History  . Marital status: Married    Spouse name: Not on file  . Number of children: Not on file  . Years of education: Not on file  . Highest education level: Not on file  Social Needs  . Financial resource strain: Not on file  . Food insecurity - worry: Not on file  . Food insecurity - inability: Not on file  . Transportation needs - medical: Not on file  . Transportation needs - non-medical: Not on file  Occupational History  . Not on file  Tobacco Use  . Smoking status: Never Smoker  . Smokeless tobacco: Never Used  Substance and Sexual Activity  . Alcohol use: No    Alcohol/week: 0.0 oz  . Drug use: No  . Sexual activity: No  Other Topics Concern  . Not on file  Social History Narrative  . Not on file   Health Maintenance  Topic Date Due  . HIV Screening  08/11/1969  . PAP SMEAR  08/12/1975  . COLONOSCOPY  08/11/2004  . MAMMOGRAM  10/13/2016  . INFLUENZA VACCINE  11/22/2016  . TETANUS/TDAP  03/31/2024  . Hepatitis C Screening  Completed    The following portions of the patient's history were reviewed and updated as appropriate: allergies, current medications, past family history, past medical history, past social history, past surgical history and problem list.  Review of Systems A comprehensive review of systems was  negative.   Objective:    There were no vitals taken for this visit. General appearance: alert, cooperative and appears stated age Head: Normocephalic, without obvious abnormality, atraumatic Eyes: conj clear, EOMI, PEERLA Ears: normal TM's and external ear canals both ears Nose: Nares normal. Septum midline. Mucosa normal. No drainage or sinus tenderness. Throat: lips, mucosa, and tongue normal; teeth and gums normal Neck: no adenopathy, no carotid bruit, no JVD, supple, symmetrical, trachea midline and thyroid not enlarged, symmetric, no tenderness/mass/nodules Back: symmetric, no curvature. ROM normal. No CVA tenderness. Lungs: clear to auscultation bilaterally Breasts: normal appearance, no masses or tenderness Heart: regular rate and rhythm, S1, S2 normal, no murmur, click, rub or gallop Abdomen: soft, non-tender; bowel sounds normal; no masses,  no organomegaly Pelvic: cervix normal in appearance, external genitalia normal, no adnexal masses or tenderness, no cervical motion tenderness, rectovaginal septum normal, uterus normal size, shape, and consistency and vagina normal without discharge Extremities: extremities normal, atraumatic, no cyanosis or edema Pulses: 2+ and symmetric Skin: Skin color, texture, turgor normal. No rashes or lesions Lymph nodes: Cervical, supraclavicular, and axillary nodes normal. Neurologic: Alert and oriented X 3, normal strength and tone. Normal symmetric reflexes. Normal coordination and gait    Assessment:    Healthy female exam.      Plan:     See After Visit Summary for Counseling  Recommendations   Keep up a regular exercise program and make sure you are eating a healthy diet Try to eat 4 servings of dairy a day, or if you are lactose intolerant take a calcium with vitamin D daily.  Your vaccines are up to date.   Point pain and swelling with a history of psoriasis-we will do a sed rate CRP also check an anti-CCP.  Consider that she could  have psoriatic arthritis.  Set a referral to rheumatology in the future.  She also has several large seborrheic keratoses that she would like to have frozen.  Encouraged her to check with her insurance to see if it is covered under her plan.  She also has a corn on the bottom of her right foot.  Again I would recommend that she check with her insurance to see if cryotherapy is a covered service.  Mammogram order placed.  Pap smear performed today.  Will call her with results once available.

## 2017-03-10 LAB — LIPID PANEL W/REFLEX DIRECT LDL
CHOLESTEROL: 183 mg/dL (ref <200)
HDL: 82 mg/dL (ref >50)
LDL CHOLESTEROL (CALC): 79 mg/dL
Non-HDL Cholesterol (Calc): 101 mg/dL (calc) (ref <130)
Total CHOL/HDL Ratio: 2.2 (calc) (ref <5.0)
Triglycerides: 120 mg/dL (ref <150)

## 2017-03-10 LAB — COMPLETE METABOLIC PANEL WITH GFR
AG RATIO: 1.6 (calc) (ref 1.0–2.5)
ALBUMIN MSPROF: 4.4 g/dL (ref 3.6–5.1)
ALKALINE PHOSPHATASE (APISO): 51 U/L (ref 33–130)
ALT: 14 U/L (ref 6–29)
AST: 17 U/L (ref 10–35)
BILIRUBIN TOTAL: 0.4 mg/dL (ref 0.2–1.2)
BUN: 20 mg/dL (ref 7–25)
CHLORIDE: 101 mmol/L (ref 98–110)
CO2: 28 mmol/L (ref 20–32)
Calcium: 9.8 mg/dL (ref 8.6–10.4)
Creat: 0.68 mg/dL (ref 0.50–0.99)
GFR, Est African American: 109 mL/min/{1.73_m2} (ref >=60)
GFR, Est Non African American: 94 mL/min/{1.73_m2} (ref >=60)
GLUCOSE: 123 mg/dL — AB (ref 65–99)
Globulin: 2.8 g/dL (calc) (ref 1.9–3.7)
POTASSIUM: 3.9 mmol/L (ref 3.5–5.3)
Sodium: 137 mmol/L (ref 135–146)
Total Protein: 7.2 g/dL (ref 6.1–8.1)

## 2017-03-10 LAB — TSH: TSH: 0.97 mIU/L (ref 0.40–4.50)

## 2017-03-12 LAB — SEDIMENTATION RATE: Sed Rate: 9 mm/h (ref 0–30)

## 2017-03-12 LAB — C-REACTIVE PROTEIN: CRP: 0.6 mg/L (ref <8.0)

## 2017-03-12 LAB — RHEUMATOID FACTOR

## 2017-03-12 LAB — CYCLIC CITRUL PEPTIDE ANTIBODY, IGG: CYCLIC CITRULLIN PEPTIDE AB: 17 U

## 2017-03-13 ENCOUNTER — Other Ambulatory Visit: Payer: Self-pay | Admitting: *Deleted

## 2017-03-13 DIAGNOSIS — K219 Gastro-esophageal reflux disease without esophagitis: Secondary | ICD-10-CM

## 2017-03-13 MED ORDER — OMEPRAZOLE 20 MG PO CPDR: 20.0000 mg | DELAYED_RELEASE_CAPSULE | Freq: Every day | ORAL | 3 refills | Status: DC

## 2017-03-14 ENCOUNTER — Telehealth: Payer: Self-pay | Admitting: Family Medicine

## 2017-03-14 ENCOUNTER — Other Ambulatory Visit: Payer: Self-pay | Admitting: Family Medicine

## 2017-03-14 ENCOUNTER — Ambulatory Visit (INDEPENDENT_AMBULATORY_CARE_PROVIDER_SITE_OTHER): Payer: Managed Care, Other (non HMO)

## 2017-03-14 DIAGNOSIS — Z1231 Encounter for screening mammogram for malignant neoplasm of breast: Secondary | ICD-10-CM

## 2017-03-14 LAB — CYTOLOGY - PAP
Diagnosis: NEGATIVE
HPV (WINDOPATH): NOT DETECTED

## 2017-03-14 NOTE — Telephone Encounter (Signed)
Should be CPT  17000 and [id number].

## 2017-03-14 NOTE — Telephone Encounter (Signed)
Patient advised of information. 

## 2017-03-14 NOTE — Telephone Encounter (Signed)
-----   Message from Royetta AsalPatricia A Klaers, RN sent at 03/13/2017  9:30 AM EST ----- [facility] upcoming removal of seborrheic keratosis on breast and foot. Could this information be called to patient, and then have her call [employer] please.pk

## 2017-03-14 NOTE — Progress Notes (Signed)
Call patient: Your Pap smear is normal. Repeat in 5 years.

## 2017-03-26 ENCOUNTER — Other Ambulatory Visit: Payer: Self-pay | Admitting: *Deleted

## 2017-03-26 DIAGNOSIS — Z1211 Encounter for screening for malignant neoplasm of colon: Secondary | ICD-10-CM

## 2017-03-26 LAB — POC HEMOCCULT BLD/STL (HOME/3-CARD/SCREEN)
Card #2 Fecal Occult Blod, POC: NEGATIVE
FECAL OCCULT BLD: NEGATIVE
Fecal Occult Blood, POC: NEGATIVE

## 2017-08-14 ENCOUNTER — Other Ambulatory Visit: Payer: Self-pay | Admitting: *Deleted

## 2017-08-14 MED ORDER — MELOXICAM 15 MG PO TABS: 15.0000 mg | ORAL_TABLET | Freq: Every day | ORAL | 1 refills | Status: DC

## 2017-09-06 ENCOUNTER — Ambulatory Visit: Payer: Managed Care, Other (non HMO) | Admitting: Family Medicine

## 2017-09-13 ENCOUNTER — Ambulatory Visit: Payer: Managed Care, Other (non HMO) | Admitting: Family Medicine

## 2017-09-18 ENCOUNTER — Encounter: Payer: Self-pay | Admitting: Family Medicine

## 2017-09-18 ENCOUNTER — Ambulatory Visit (INDEPENDENT_AMBULATORY_CARE_PROVIDER_SITE_OTHER): Payer: Managed Care, Other (non HMO) | Admitting: Family Medicine

## 2017-09-18 VITALS — BP 116/69 | HR 73 | Ht 66.0 in | Wt 151.0 lb

## 2017-09-18 DIAGNOSIS — L821 Other seborrheic keratosis: Secondary | ICD-10-CM

## 2017-09-18 DIAGNOSIS — F411 Generalized anxiety disorder: Secondary | ICD-10-CM

## 2017-09-18 DIAGNOSIS — G4733 Obstructive sleep apnea (adult) (pediatric): Secondary | ICD-10-CM | POA: Diagnosis not present

## 2017-09-18 DIAGNOSIS — F5101 Primary insomnia: Secondary | ICD-10-CM | POA: Diagnosis not present

## 2017-09-18 MED ORDER — AMBULATORY NON FORMULARY MEDICATION: 99 refills | Status: AC

## 2017-09-18 NOTE — Progress Notes (Signed)
Subjective:    Patient ID: Desiree Hughes, female    DOB: 05-08-1955, 62 y.o.   MRN: [id number]  HPI 30-4-year-old comes in today for six-month follow-up.  She is doing okay overall.  She is been having some more problems with her right knee.  She is had problems on and off for several years.  She still says it occasionally will swell and notices more pain and discomfort when walking upstairs.  She was previously walking 5 miles per day but with her new dog they have not been walking quite as much because he does not like to walk.  She says occasionally she gets some pinching pain in her low back as well.  Insomnia-uses her trazodone pretty regularly.  She also has sleep apnea.  She reports that her machine is working well but needs new supplies.  She would like to use any company besides [employer].  She also needs new headgear.  Interestingly, she tries to get about 8 hours of sleep each night each night but her Fitbit says she only sleeps about 5.  She thinks she might move a lot in her sleep.  Seborrheic keratoses-she would like some topical treatment if available. She saw something online about a concentrated hydrogen peroxide.  He says they often crack and bleed and are quite bothersome to her.  Anxiety.  It is up and down.  She still helps take care of her adult son who has diabetes.  She just little frustrated she is not very motivated outgoing and she has to do a lot for him.  He currently is without health insurance.  Review of Systems  BP 116/69   Pulse 73   Ht  (1.676 m)   Wt 151 lb (68.5 kg)   SpO2 99%   BMI 24.37 kg/m     Allergies  Allergen Reactions  . Lisinopril Cough    Past Medical History:  Diagnosis Date  . Herniated disc, cervical   . Hypertension     Past Surgical History:  Procedure Laterality Date  . BREAST CYST ASPIRATION Left   . TUBAL LIGATION      Social History   Socioeconomic History  . Marital status: Married    Spouse name: Not on file  .  Number of children: Not on file  . Years of education: Not on file  . Highest education level: Not on file  Occupational History  . Not on file  Social Needs  . Financial resource strain: Not on file  . Food insecurity:    Worry: Not on file    Inability: Not on file  . Transportation needs:    Medical: Not on file    Non-medical: Not on file  Tobacco Use  . Smoking status: Never Smoker  . Smokeless tobacco: Never Used  Substance and Sexual Activity  . Alcohol use: No    Alcohol/week: 0.0 oz  . Drug use: No  . Sexual activity: Never  Lifestyle  . Physical activity:    Days per week: Not on file    Minutes per session: Not on file  . Stress: Not on file  Relationships  . Social connections:    Talks on phone: Not on file    Gets together: Not on file    Attends religious service: Not on file    Active member of club or organization: Not on file    Attends meetings of clubs or organizations: Not on file    Relationship status: Not  on file  . Intimate partner violence:    Fear of current or ex partner: Not on file    Emotionally abused: Not on file    Physically abused: Not on file    Forced sexual activity: Not on file  Other Topics Concern  . Not on file  Social History Narrative  . Not on file    Family History  Problem Relation Age of Onset  . Alcohol abuse Father   . Cancer Father   . Heart disease Father   . Diabetes Father   . Hyperlipidemia Father   . Hypertension Father   . Heart disease Brother   . Asthma Brother   . Depression Son   . Diabetes Son   . Diabetes Mother   . Hyperlipidemia Mother   . Hypertension Mother     Outpatient Encounter Medications as of 09/18/2017  Medication Sig  . B Complex-C (SUPER B COMPLEX PO) Take by mouth.  Marland Kitchen BIOTIN PO Take by mouth.  Marland Kitchen CALCIUM PO Take by mouth.  . Cholecalciferol (VITAMIN D-3 PO) Take by mouth.  . Flax OIL Take by mouth.  . fluticasone (FLONASE) 50 MCG/ACT nasal spray Place 1 spray daily into  both nostrils.  Marland Kitchen ibuprofen (ADVIL,MOTRIN) 800 MG tablet Take 800 mg by mouth every 8 (eight) hours as needed.  Marland Kitchen MELATONIN PO Take by mouth.  . meloxicam (MOBIC) 15 MG tablet Take 1 tablet (15 mg total) by mouth daily. Take with food each morning  . MILK THISTLE PO Take by mouth.  . Multiple Vitamins-Minerals (CENTRUM SILVER ULTRA WOMENS PO) Take by mouth.  . Omega-3 Fatty Acids (FISH OIL PO) Take by mouth.  Marland Kitchen omeprazole (PRILOSEC) 20 MG capsule Take 1 capsule (20 mg total) by mouth daily.  . traZODone (DESYREL) 50 MG tablet Take 3 tablets (150 mg total) at bedtime by mouth.  . vitamin C (ASCORBIC ACID) 500 MG tablet Take 500 mg by mouth daily.  . [DISCONTINUED] betamethasone valerate (VALISONE) 0.1 % cream Apply topically daily.  . [DISCONTINUED] cyclobenzaprine (FLEXERIL) 10 MG tablet take 1/2 to 1 tablet by mouth at bedtime if needed for muscle spasm   No facility-administered encounter medications on file as of 09/18/2017.          Objective:   Physical Exam  Constitutional: She is oriented to person, place, and time. She appears well-developed and well-nourished.  HENT:  Head: Normocephalic and atraumatic.  Cardiovascular: Normal rate, regular rhythm and normal heart sounds.  Pulmonary/Chest: Effort normal and breath sounds normal.  Neurological: She is alert and oriented to person, place, and time.  Skin: Skin is warm and dry.  Psychiatric: She has a normal mood and affect. Her behavior is normal.    MSK: Right knee with some edema laterally.  Nontender along the joint lines.  Significant crepitus with flexion extension.  Strength is 5 out of 5.      Assessment & Plan:  Right knee pain-most consistent with patellofemoral syndrome based on her description and on her exam.  Recommendation would be for home stretches and exercises and continue to use anti-inflammatory as needed.  If she continues to have increasing pain or gets worse and I would like her see 1 of our sports  medicine providers for further evaluation.  She likely has some arthritis in that knee as well as she does and multiple other joints and we can always get an updated x-ray if needed.  Okay to use the meloxicam as needed.  Insomnia-continue  trazodone as needed.  Obstructive sleep apnea-we will place new order for headgear tubing and mask.  Seborrheic keratoses-there is some evidence that tretinoin works but I did not see anything about hydroperoxide I will try to do a little further investigation and see if there may be some new products on the market.  GAD - Gad 7 score of 6 today and PHQ 9 score of 5.  Continue current regimen.

## 2017-09-24 ENCOUNTER — Telehealth: Payer: Self-pay | Admitting: *Deleted

## 2017-09-24 NOTE — Telephone Encounter (Signed)
Called pt and informed her that her [facility] compliance [facility] so I faxed her supply order to them. Advised that she can reach out to them if she wanted to at:  [phone].Heath GoldBarkley, Chinmay Squier [place], [place]   Order faxed to : [phone].Heath GoldBarkley, Dru Laurel Lynetta, CMA

## 2018-02-10 ENCOUNTER — Other Ambulatory Visit: Payer: Self-pay | Admitting: Family Medicine

## 2018-02-13 ENCOUNTER — Other Ambulatory Visit: Payer: Self-pay | Admitting: Family Medicine

## 2018-02-13 DIAGNOSIS — K219 Gastro-esophageal reflux disease without esophagitis: Secondary | ICD-10-CM

## 2018-03-25 ENCOUNTER — Encounter: Payer: Self-pay | Admitting: Family Medicine

## 2018-03-25 ENCOUNTER — Ambulatory Visit (INDEPENDENT_AMBULATORY_CARE_PROVIDER_SITE_OTHER): Payer: Managed Care, Other (non HMO) | Admitting: Family Medicine

## 2018-03-25 VITALS — BP 109/58 | HR 70 | Ht 65.75 in | Wt 140.0 lb

## 2018-03-25 DIAGNOSIS — G4733 Obstructive sleep apnea (adult) (pediatric): Secondary | ICD-10-CM | POA: Diagnosis not present

## 2018-03-25 DIAGNOSIS — K219 Gastro-esophageal reflux disease without esophagitis: Secondary | ICD-10-CM | POA: Diagnosis not present

## 2018-03-25 DIAGNOSIS — F411 Generalized anxiety disorder: Secondary | ICD-10-CM

## 2018-03-25 DIAGNOSIS — R7309 Other abnormal glucose: Secondary | ICD-10-CM | POA: Diagnosis not present

## 2018-03-25 DIAGNOSIS — Z23 Encounter for immunization: Secondary | ICD-10-CM | POA: Diagnosis not present

## 2018-03-25 DIAGNOSIS — F5101 Primary insomnia: Secondary | ICD-10-CM

## 2018-03-25 LAB — POCT GLYCOSYLATED HEMOGLOBIN (HGB A1C): Hemoglobin A1C: 5.3 % (ref 4.0–5.6)

## 2018-03-25 MED ORDER — AMBULATORY NON FORMULARY MEDICATION: 0 refills | Status: DC

## 2018-03-25 NOTE — Progress Notes (Signed)
Subjective:    CC: 6 mo f/u   HPI:  F/U OSA -she wears her CPAP most nights but says she is usually only able to keep it on for about 4 hours before it gets frustrating or she gets really dry in her nasal passages and has to take it off.  She does feel like her apnea has been a lot better since she is lost a lot of some weight.  F/U INsomnia -uses trazodone to help her sleep.  Tolerating it well without any side effects or problems.  F/U GERD -take her reflux medicine frequently.  F/U GAD -she does report feeling nervous and anxious several days a week as well as feeling like she worries too much and has trouble relaxing.  Elevated glucose-  She is actually been using her son's glucometer and checking her sugars they have mostly been running between the 90s and low 100s.  She is actually been working really hard on eating a low-carb diet trying to stay active and lose weight.  Past medical history, Surgical history, Family history not pertinant except as noted below, Social history, Allergies, and medications have been entered into the medical record, reviewed, and corrections made.   Review of Systems: No fevers, chills, night sweats, weight loss, chest pain, or shortness of breath.   Objective:    General: Well Developed, well nourished, and in no acute distress.  Neuro: Alert and oriented x3, extra-ocular muscles intact, sensation grossly intact.  HEENT: Normocephalic, atraumatic  Skin: Warm and dry, no rashes. Cardiac: Regular rate and rhythm, no murmurs rubs or gallops, no lower extremity edema.  Respiratory: Clear to auscultation bilaterally. Not using accessory muscles, speaking in full sentences.   Impression and Recommendations:    OSA -try to contact [facility] for download.  Otherwise she does feel like it is working well and she does feel like she is able to wear it for about 4 hours most nights she is is not able to wear it all night long.  GERD -Stable.  Continue current  regimen.  INsomnia -stable.  Continue current regimen.  Generalized anxiety disorder-gad 7 score of 3 today which is down from 6 previously.  Negative PHQ 9.  Elevated glucose-due to check a hemoglobin A1c today. In the normal range.  Gave her reassurance.  She is actually done a great job with dietary changes and weight loss that she is made. Lab Results  Component Value Date   HGBA1C 5.3 03/25/2018

## 2018-03-26 LAB — COMPLETE METABOLIC PANEL WITH GFR
AG Ratio: 1.7 (calc) (ref 1.0–2.5)
ALBUMIN MSPROF: 4.5 g/dL (ref 3.6–5.1)
ALKALINE PHOSPHATASE (APISO): 42 U/L (ref 33–130)
ALT: 13 U/L (ref 6–29)
AST: 15 U/L (ref 10–35)
BUN: 14 mg/dL (ref 7–25)
CO2: 30 mmol/L (ref 20–32)
CREATININE: 0.65 mg/dL (ref 0.50–0.99)
Calcium: 9.8 mg/dL (ref 8.6–10.4)
Chloride: 100 mmol/L (ref 98–110)
GFR, Est African American: 110 mL/min/{1.73_m2} (ref >=60)
GFR, Est Non African American: 94 mL/min/{1.73_m2} (ref >=60)
GLUCOSE: 91 mg/dL (ref 65–99)
Globulin: 2.7 g/dL (calc) (ref 1.9–3.7)
Potassium: 4.4 mmol/L (ref 3.5–5.3)
Sodium: 138 mmol/L (ref 135–146)
TOTAL PROTEIN: 7.2 g/dL (ref 6.1–8.1)
Total Bilirubin: 0.5 mg/dL (ref 0.2–1.2)

## 2018-03-26 LAB — LIPID PANEL
Cholesterol: 196 mg/dL (ref <200)
HDL: 77 mg/dL (ref >50)
LDL CHOLESTEROL (CALC): 101 mg/dL — AB
NON-HDL CHOLESTEROL (CALC): 119 mg/dL (ref <130)
Total CHOL/HDL Ratio: 2.5 (calc) (ref <5.0)
Triglycerides: 89 mg/dL (ref <150)

## 2018-03-26 LAB — TSH: TSH: 1.22 m[IU]/L (ref 0.40–4.50)

## 2018-04-05 ENCOUNTER — Other Ambulatory Visit: Payer: Self-pay | Admitting: *Deleted

## 2018-04-05 DIAGNOSIS — Z1211 Encounter for screening for malignant neoplasm of colon: Secondary | ICD-10-CM

## 2018-04-05 LAB — POC HEMOCCULT BLD/STL (HOME/3-CARD/SCREEN)
Card #2 Fecal Occult Blod, POC: NEGATIVE
Card #3 Fecal Occult Blood, POC: NEGATIVE
Fecal Occult Blood, POC: NEGATIVE

## 2018-05-11 ENCOUNTER — Other Ambulatory Visit: Payer: Self-pay | Admitting: Family Medicine

## 2018-08-10 ENCOUNTER — Emergency Department
Admission: EM | Admit: 2018-08-10 | Discharge: 2018-08-10 | Disposition: A | Discharge status: Home / Self Care | Payer: BLUE CROSS/BLUE SHIELD | Source: Home / Self Care

## 2018-08-10 ENCOUNTER — Telehealth: Payer: Self-pay | Admitting: Emergency Medicine

## 2018-08-10 ENCOUNTER — Encounter: Payer: Self-pay | Admitting: Emergency Medicine

## 2018-08-10 DIAGNOSIS — H8111 Benign paroxysmal vertigo, right ear: Secondary | ICD-10-CM

## 2018-08-10 DIAGNOSIS — S01312A Laceration without foreign body of left ear, initial encounter: Secondary | ICD-10-CM

## 2018-08-10 DIAGNOSIS — W010XXA Fall on same level from slipping, tripping and stumbling without subsequent striking against object, initial encounter: Secondary | ICD-10-CM

## 2018-08-10 DIAGNOSIS — R110 Nausea: Secondary | ICD-10-CM

## 2018-08-10 LAB — POCT FASTING CBG KUC MANUAL ENTRY: POCT Glucose (KUC): 139 mg/dL — AB (ref 70–99)

## 2018-08-10 MED ORDER — MECLIZINE HCL 25 MG PO TABS: 25.0000 mg | ORAL_TABLET | Freq: Three times a day (TID) | ORAL | 0 refills | Status: DC | PRN

## 2018-08-10 NOTE — Discharge Instructions (Addendum)
Antivert (meclizine) is a medication to help with dizziness and nausea related to vertigo.  This medication can cause drowsiness. Do not operate heavy machinery or drive while taking.    Please use caution around your Left ear lobe as you do have absorbable sutures in.  They should fall out in about 7-10 days. If you are concerned they have come out too soon or you are concerned for infection -increased pain, redness, swelling, drainage of pus, please follow up with family medicine or return to urgent care.  Please follow up with your family medicine provider in 2-3 days if dizziness and nausea have persisted.   Call 911 or have someone drive you to the hospital if dizziness worsens despite use of the Meclizine, unable to keep down fluids, develop a severe headache, or other new concerning symptoms develop.

## 2018-08-10 NOTE — ED Triage Notes (Signed)
Pt c/o left earlobe laceration. States she fell around 4 am and her left earring got ripped from her ear. She denies LOC but c/o dizziness.

## 2018-08-10 NOTE — ED Provider Notes (Signed)
[facility]    CSN: [id number] Arrival date & time: 08/10/18  1037     History   Chief Complaint Chief Complaint  Patient presents with   Laceration    HPI Desiree Hughes is a 63 y.o. female.   HPI Desiree Hughes is a 63 y.o. female presenting to UC with c/o laceration to her Left earlobe that occurred around 4AM this morning when she got up to go to the bathroom. Pt reports tripping and falling, catching her earring on she believes the toilet paper holder but denies otherwise hitting her head or LOC.  She has had intermittent dizziness that is worse with head movement and nausea but no vomiting.  Hx of vertigo several years ago, but she did not have nausea at that time and those symptoms resolved within 1 day.  She believes that episode was due to mild dehydration due to working in the yard and not drinking enough water at the time.  She does report working in the yard yesterday but ate dinner and felt well going to bed.  She has not had anything to eat all day today but notes she has been doing intermittent fasting since October 2019 and usually does not eat until 11/11:30AM. Denies hx of DM.  She use to have HTN but was taken off her BP medication after losing 50 pounds.  She takes trazodone to help her sleep but she has been on this medication for several years. No dose changes. The trazodone occasionally causes nausea but never dizziness.  She denies weakness or numbness in her arms or legs. Denies HA or change in vision at this time.    Past Medical History:  Diagnosis Date   Herniated disc, cervical    Hypertension     Patient Active Problem List   Diagnosis Date Noted   Insomnia 02/08/2016   GAD (generalized anxiety disorder) 12/29/2014   GERD (gastroesophageal reflux disease) 10/07/2014   Chronic pain syndrome 10/07/2014   Carpal tunnel syndrome 09/22/2014   Obstructive sleep apnea 09/22/2014   Psoriasis 09/22/2014    Past Surgical History:    Procedure Laterality Date   BREAST CYST ASPIRATION Left    TUBAL LIGATION      OB History   No obstetric history on file.      Home Medications    Prior to Admission medications   Medication Sig Start Date End Date Taking? Authorizing Provider  AMBULATORY NON FORMULARY MEDICATION Medication Name: Needs new headgear, mask, tubing for her CPAP machine.  Please order through [facility] 09/18/17   Agapito Games, MD  AMBULATORY NON FORMULARY MEDICATION Contact [facility] for download from her CPAP for the last 30 days 03/25/18   Agapito Games, MD  B Complex-C (SUPER B COMPLEX PO) Take by mouth.    [provider]  BIOTIN PO Take by mouth.    [provider]  CALCIUM PO Take by mouth.    [provider]  Cholecalciferol (VITAMIN D-3 PO) Take by mouth.    [provider]  Flax OIL Take by mouth.    [provider]  fluticasone (FLONASE) 50 MCG/ACT nasal spray Place 1 spray daily into both nostrils. 03/09/17   Agapito Games, MD  ibuprofen (ADVIL,MOTRIN) 800 MG tablet Take 800 mg by mouth every 8 (eight) hours as needed.    [provider]  meclizine (ANTIVERT) 25 MG tablet Take 1 tablet (25 mg total) by mouth 3 (three) times daily as needed for dizziness.  08/10/18   Lurene ShadowPhelps, Aleayah [place] O, PA-C  MELATONIN PO Take by mouth.    [provider]  meloxicam (MOBIC) 15 MG tablet TAKE 1 TABLET DAILY EVERY MORNING WITH FOOD 05/13/18   Agapito GamesMetheney, Catherine D, MD  MILK THISTLE PO Take by mouth.    [provider]  Multiple Vitamins-Minerals (CENTRUM SILVER ULTRA WOMENS PO) Take by mouth.    [provider]  Omega-3 Fatty Acids (FISH OIL PO) Take by mouth.    [provider]  omeprazole (PRILOSEC) 20 MG capsule TAKE 1 CAPSULE DAILY 02/13/18   Agapito GamesMetheney, Catherine D, MD  traZODone (DESYREL) 50 MG tablet TAKE 3 TABLETS AT BEDTIME 02/12/18   Agapito GamesMetheney, Catherine D, MD  vitamin C (ASCORBIC ACID) 500 MG tablet Take 500  mg by mouth daily.    [provider]    Family History Family History  Problem Relation Age of Onset   Alcohol abuse Father    Cancer Father    Heart disease Father    Diabetes Father    Hyperlipidemia Father    Hypertension Father    Heart disease Brother    Asthma Brother    Depression Son    Diabetes Son    Diabetes Mother    Hyperlipidemia Mother    Hypertension Mother     Social History Social History   Tobacco Use   Smoking status: Never Smoker   Smokeless tobacco: Never Used  Substance Use Topics   Alcohol use: No    Alcohol/week: 0.0 standard drinks   Drug use: No     Allergies   Lisinopril   Review of Systems Review of Systems  Constitutional: Negative for chills and fever.  Eyes: Negative for photophobia, pain and visual disturbance.  Respiratory: Negative for shortness of breath.   Cardiovascular: Negative for chest pain, palpitations and leg swelling.  Musculoskeletal: Negative for neck pain and neck stiffness.  Skin: Positive for wound. Negative for color change.  Neurological: Positive for dizziness and light-headedness. Negative for syncope, weakness, numbness and headaches.     Physical Exam Triage Vital Signs ED Triage Vitals  Enc Vitals Group     BP 08/10/18 1121 109/74     Pulse Rate 08/10/18 1121 86     Resp --      Temp 08/10/18 1121 98.2 F (36.8 C)     Temp Source 08/10/18 1121 Oral     SpO2 08/10/18 1121 99 %     Weight --      Height --      Head Circumference --      Peak Flow --      Pain Score 08/10/18 1122 0     Pain Loc --      Pain Edu? --      Excl. in GC? --    Orthostatic VS for the past 24 hrs:  BP- Lying Pulse- Lying BP- Sitting Pulse- Sitting BP- Standing at 0 minutes Pulse- Standing at 0 minutes  08/10/18 1147 109/69 85 116/79 86 109/75 86    Updated Vital Signs BP 109/74    Pulse 86    Temp 98.2 F (36.8 C) (Oral)    SpO2 99%   Visual Acuity Right Eye Distance:   Left Eye  Distance:   Bilateral Distance:    Right Eye Near:   Left Eye Near:    Bilateral Near:     Physical Exam Vitals signs and nursing note reviewed.  Constitutional:      Appearance: Normal  appearance. She is well-developed.  HENT:     Head: Normocephalic and atraumatic.     Right Ear: Tympanic membrane normal.     Left Ear: Tympanic membrane normal. Laceration ( earlobe) present.     Ears:      Nose: Nose normal.     Right Sinus: No maxillary sinus tenderness or frontal sinus tenderness.     Left Sinus: No maxillary sinus tenderness or frontal sinus tenderness.     Mouth/Throat:     Lips: Pink.     Mouth: Mucous membranes are moist.     Pharynx: Oropharynx is clear. Uvula midline.  Eyes:     Extraocular Movements: Extraocular movements intact.     Pupils: Pupils are equal, round, and reactive to light.  Neck:     Musculoskeletal: Normal range of motion and neck supple.     Comments: No midline bone tenderness, no crepitus or step-offs.  Cardiovascular:     Rate and Rhythm: Normal rate and regular rhythm.  Pulmonary:     Effort: Pulmonary effort is normal.     Breath sounds: Normal breath sounds.  Musculoskeletal: Normal range of motion.  Skin:    General: Skin is warm and dry.     Capillary Refill: Capillary refill takes less than 2 seconds.  Neurological:     General: No focal deficit present.     Mental Status: She is alert and oriented to person, place, and time.     Cranial Nerves: No cranial nerve deficit.     Comments: CN II-XII in tact. Speech is clear. Alert to person, place and time. Normal finger to nose coordination. Negative pronator drift.  Slight ataxia in relation to dizziness per pt.  Psychiatric:        Mood and Affect: Mood normal.        Behavior: Behavior normal.      UC Treatments / Results  Labs (all labs ordered are listed, but only abnormal results are displayed) Labs Reviewed  POCT FASTING [facility] MANUAL ENTRY - Abnormal; Notable for the  following components:      Result Value   POCT Glucose ([facility]) 139 (*)    All other components within normal limits    EKG None  Radiology No results found.  Procedures Laceration Repair Date/Time: 08/10/2018 1:21 PM Performed by: Lurene Shadow, PA-C Authorized by: Lurene Shadow, PA-C   Consent:    Consent obtained:  Verbal   Consent given by:  Patient   Risks discussed:  Pain, infection, poor cosmetic result, poor wound healing and need for additional repair   Alternatives discussed:  No treatment Anesthesia (see MAR for exact dosages):    Anesthesia method:  Local infiltration   Local anesthetic:  Lidocaine 1% w/o epi Laceration details:    Location:  Ear   Ear location:  L ear   Length (cm):  4   Depth (mm):  4 Repair type:    Repair type:  Simple Pre-procedure details:    Preparation:  Patient was prepped and draped in usual sterile fashion Exploration:    Hemostasis achieved with:  Direct pressure   Wound exploration: wound explored through full range of motion and entire depth of wound probed and visualized     Wound extent: areolar tissue violated     Wound extent: no fascia violation noted, no foreign bodies/material noted, no muscle damage noted, no nerve damage noted, no tendon damage noted, no underlying fracture noted and no vascular damage noted  Contaminated: no   Treatment:    Area cleansed with:  Saline   Amount of cleaning:  Standard Skin repair:    Repair method:  Sutures   Suture size:  4-0   Wound skin closure material used: vicroyl    Suture technique:  Simple interrupted   Number of sutures:  5 Approximation:    Approximation:  Close Post-procedure details:    Dressing:  Non-adherent dressing   Patient tolerance of procedure:  Tolerated well, no immediate complications   (including critical care time)  Medications Ordered in UC Medications - No data to display  Initial Impression / Assessment and Plan / UC Course  I have reviewed  the triage vital signs and the nursing notes.  Pertinent labs & imaging results that were available during my care of the patient were reviewed by me and considered in my medical decision making (see chart for details).     Laceration of Left earlobe repaired as noted above. No indication for head CT at this time.  Exam most c/w BPPV Will have pt try trial of meclizine  Pt's husband drove her today. Discussed symptoms that warrant emergent care in the ED.  Final Clinical Impressions(s) / UC Diagnoses   Final diagnoses:  BPPV (benign paroxysmal positional vertigo), right  Nausea without vomiting  Laceration of left earlobe, initial encounter  Fall from slip, trip, or stumble, initial encounter     Discharge Instructions     Antivert (meclizine) is a medication to help with dizziness and nausea related to vertigo.  This medication can cause drowsiness. Do not operate heavy machinery or drive while taking.    Please use caution around your Left ear lobe as you do have absorbable sutures in.  They should fall out in about 7-10 days. If you are concerned they have come out too soon or you are concerned for infection -increased pain, redness, swelling, drainage of pus, please follow up with family medicine or return to urgent care.  Please follow up with your family medicine provider in 2-3 days if dizziness and nausea have persisted.   Call 911 or have someone drive you to the hospital if dizziness worsens despite use of the Meclizine, unable to keep down fluids, develop a severe headache, or other new concerning symptoms develop.    ED Prescriptions    Medication Sig Dispense Auth. Provider   meclizine (ANTIVERT) 25 MG tablet Take 1 tablet (25 mg total) by mouth 3 (three) times daily as needed for dizziness. 30 tablet Lurene Shadow, PA-C     Controlled Substance Prescriptions [place] Controlled Substance Registry consulted? Not Applicable   Rolla Plate 08/10/18 1341

## 2018-08-11 ENCOUNTER — Other Ambulatory Visit: Payer: Self-pay | Admitting: Family Medicine

## 2018-08-11 NOTE — Telephone Encounter (Signed)
Spoke with pt. She is feeling better than yesterday. F/u with PCP or UC as needed.

## 2018-08-11 NOTE — Telephone Encounter (Signed)
Pt notes she is doing better today. No more dizziness. Mild lightheadedness and mild nausea. Tolerating meclizine well. Encouraged to f/u with PCP or call our office with questions/concerns.

## 2018-08-12 ENCOUNTER — Other Ambulatory Visit: Payer: Self-pay | Admitting: Family Medicine

## 2018-08-12 MED ORDER — TRAZODONE HCL 50 MG PO TABS: 150.0000 mg | ORAL_TABLET | Freq: Every day | ORAL | 1 refills | Status: DC

## 2018-09-24 ENCOUNTER — Encounter: Payer: Self-pay | Admitting: Family Medicine

## 2018-09-24 ENCOUNTER — Ambulatory Visit (INDEPENDENT_AMBULATORY_CARE_PROVIDER_SITE_OTHER): Payer: BC Managed Care – PPO | Admitting: Family Medicine

## 2018-09-24 VITALS — BP 104/69 | HR 83 | Temp 98.4°F | Ht 67.0 in | Wt 137.0 lb

## 2018-09-24 DIAGNOSIS — G4733 Obstructive sleep apnea (adult) (pediatric): Secondary | ICD-10-CM | POA: Diagnosis not present

## 2018-09-24 DIAGNOSIS — R7309 Other abnormal glucose: Secondary | ICD-10-CM | POA: Diagnosis not present

## 2018-09-24 DIAGNOSIS — F5101 Primary insomnia: Secondary | ICD-10-CM

## 2018-09-24 NOTE — Progress Notes (Signed)
BS: 89 ptn reports that since her fall she still gets really dizzy. The meclizine helped but she gets this way when she bends down or moves to fast and she tries to take this slowly but still has issues.Marland KitchenMarland KitchenHeath Gold, CMA

## 2018-09-24 NOTE — Progress Notes (Signed)
Virtual Visit via Video Note  I connected with Desiree Hughes on 09/24/18 at  8:10 AM EDT by a video enabled telemedicine application and verified that I am speaking with the correct person using two identifiers.   I discussed the limitations of evaluation and management by telemedicine and the availability of in person appointments. The patient expressed understanding and agreed to proceed.  Pt was at home and I was in my office for the virtual visit.     Subjective:    CC: 6 mo f/u   HPI:  F/U OSA - she has been using her CPAP consistantly unless she has severe nasal congestion.  She is on her flonase year round.   F/U insomnia -currently using trazodone to aid in her sleep. She has been on it for years and it works well for her.    Follow-up elevated glucose-her last A1c actually looks good but she is also been working on her diet making some changes.  Prior to that she had been using her son's glucometer and had noticed some elevated blood sugars intermittently.  She was dizzy and fell in 08/10/2018 and had a laceration to her ear and required sutures. reports that since her fall she still gets really dizzy. The meclizine helped but she gets this way when she bends down or moves to fast and she tries to take this slowly but still has issues. Worse when she lays down at night. Can last for about 10-30 seconds.   Past medical history, Surgical history, Family history not pertinant except as noted below, Social history, Allergies, and medications have been entered into the medical record, reviewed, and corrections made.   Review of Systems: No fevers, chills, night sweats, weight loss, chest pain, or shortness of breath.   Objective:    General: Speaking clearly in complete sentences without any shortness of breath.  Alert and oriented x3.  Normal judgment. No apparent acute distress. Well groomed.     Impression and Recommendations:    Sleep apnea-continue wearing CPAP  regularly.  Insomnia- will refill her trazodone.    Elevated glucose-due for 19-month hemoglobin A1c. Lat one looked good.       I discussed the assessment and treatment plan with the patient. The patient was provided an opportunity to ask questions and all were answered. The patient agreed with the plan and demonstrated an understanding of the instructions.   The patient was advised to call back or seek an in-person evaluation if the symptoms worsen or if the condition fails to improve as anticipated.   Nani Gasser, MD

## 2018-09-25 LAB — HEMOGLOBIN A1C
Hgb A1c MFr Bld: 5.2 % of total Hgb (ref <5.7)
Mean Plasma Glucose: 103 (calc)
eAG (mmol/L): 5.7 (calc)

## 2019-02-08 ENCOUNTER — Other Ambulatory Visit: Payer: Self-pay | Admitting: Family Medicine

## 2019-05-09 ENCOUNTER — Other Ambulatory Visit: Payer: Self-pay | Admitting: Family Medicine

## 2019-05-09 DIAGNOSIS — K219 Gastro-esophageal reflux disease without esophagitis: Secondary | ICD-10-CM

## 2019-07-17 ENCOUNTER — Other Ambulatory Visit: Payer: Self-pay

## 2019-07-17 ENCOUNTER — Ambulatory Visit (INDEPENDENT_AMBULATORY_CARE_PROVIDER_SITE_OTHER): Payer: BC Managed Care – PPO

## 2019-07-17 ENCOUNTER — Telehealth (INDEPENDENT_AMBULATORY_CARE_PROVIDER_SITE_OTHER): Payer: BC Managed Care – PPO | Admitting: Medical-Surgical

## 2019-07-17 ENCOUNTER — Encounter: Payer: Self-pay | Admitting: Medical-Surgical

## 2019-07-17 VITALS — BP 112/65 | HR 98 | Temp 100.2°F | Ht 64.0 in | Wt 132.6 lb

## 2019-07-17 DIAGNOSIS — J329 Chronic sinusitis, unspecified: Secondary | ICD-10-CM | POA: Diagnosis not present

## 2019-07-17 DIAGNOSIS — R0602 Shortness of breath: Secondary | ICD-10-CM

## 2019-07-17 DIAGNOSIS — J40 Bronchitis, not specified as acute or chronic: Secondary | ICD-10-CM

## 2019-07-17 DIAGNOSIS — R05 Cough: Secondary | ICD-10-CM | POA: Diagnosis not present

## 2019-07-17 MED ORDER — BENZONATATE 100 MG PO CAPS: 100.0000 mg | ORAL_CAPSULE | Freq: Three times a day (TID) | ORAL | 0 refills | Status: AC | PRN

## 2019-07-17 MED ORDER — IPRATROPIUM BROMIDE 0.03 % NA SOLN: 2.0000 | Freq: Two times a day (BID) | NASAL | 0 refills | Status: DC

## 2019-07-17 MED ORDER — GUAIFENESIN-CODEINE 100-10 MG/5ML PO SYRP: 5.0000 mL | ORAL_SOLUTION | Freq: Three times a day (TID) | ORAL | 0 refills | Status: DC | PRN

## 2019-07-17 MED ORDER — BENZONATATE 100 MG PO CAPS: 100.0000 mg | ORAL_CAPSULE | Freq: Three times a day (TID) | ORAL | 0 refills | Status: DC | PRN

## 2019-07-17 MED ORDER — AMOXICILLIN-POT CLAVULANATE 875-125 MG PO TABS: 1.0000 | ORAL_TABLET | Freq: Two times a day (BID) | ORAL | 0 refills | Status: AC

## 2019-07-17 NOTE — Progress Notes (Addendum)
Virtual Visit via Video Note  I connected with Desiree Hughes on 07/17/19 at  2:00 PM EDT by a video enabled telemedicine application and verified that I am speaking with the correct person using two identifiers.   I discussed the limitations of evaluation and management by telemedicine and the availability of in person appointments. The patient expressed understanding and agreed to proceed.  Subjective:    CC: Cough, upper respiratory symptoms  HPI: Pleasant 64 year old female presenting via [employer] video visit today with reports of approximately 7 to 10 days of fever, chills, fatigue, headache, body aches, mild shortness of breath, decreased appetite, sinus congestion, rhinorrhea, postnasal drip, and facial/ear fullness/pressure.  Also reporting a cough productive of small amounts of yellow-green thick sputum, worse at night.  Chest hurts when taking deep breaths.  Has tried Robitussin, Delsym, Mucinex, DayQuil with minimal relief.  Taking ibuprofen as needed for fever.  Tested negative for Covid 2 days ago.  Had the flu shot this year but has not received her Covid vaccines yet.  Past medical history, Surgical history, Family history not pertinant except as noted below, Social history, Allergies, and medications have been entered into the medical record, reviewed, and corrections made.   Review of Systems: No fevers, chills, night sweats, weight loss, chest pain, or shortness of breath.   Objective:    General: Speaking clearly in complete sentences without any shortness of breath.  Alert and oriented x3.  Normal judgment. No apparent acute distress.    Impression and Recommendations:    1. Sinobronchitis Discussed potential etiology of symptoms: Viral vs. bacterial.  Reviewed possibility of symptoms being Covid despite negative test.  Also on the differential is atypical pneumonia. Given duration of symptoms treating empirically with Augmentin twice daily x10 days.  Atrovent nasal spray  twice daily for rhinorrhea.  Tessalon Perles 3 times daily as needed and Robitussin-AC 3 times daily as needed for cough.  Will obtain a chest x-ray to evaluate shortness of breath in the setting of productive cough. - DG Chest 2 View; Future - amoxicillin-clavulanate (AUGMENTIN) 875-125 MG tablet; Take 1 tablet by mouth 2 (two) times daily for 10 days.  Dispense: 20 tablet; Refill: 0 - ipratropium (ATROVENT) 0.03 % nasal spray; Place 2 sprays into both nostrils every 12 (twelve) hours.  Dispense: 30 mL; Refill: 0 - benzonatate (TESSALON) 100 MG capsule; Take 1 capsule (100 mg total) by mouth 3 (three) times daily as needed for up to 7 days for cough.  Dispense: 21 capsule; Refill: 0 - guaiFENesin-codeine (ROBITUSSIN AC) 100-10 MG/5ML syrup; Take 5 mLs by mouth 3 (three) times daily as needed for cough.  Dispense: 75 mL; Refill: 0  2. Shortness of breath Chest x-ray today. - DG Chest 2 View; Future  I discussed the assessment and treatment plan with the patient. The patient was provided an opportunity to ask questions and all were answered. The patient agreed with the plan and demonstrated an understanding of the instructions.   The patient was advised to call back or seek an in-person evaluation if the symptoms worsen or if the condition fails to improve as anticipated.  Return if symptoms worsen or fail to improve.  35 minutes of non-face-to-face time was provided during this encounter.  Thayer Ohm, DNP, APRN, FNP-BC [facility]

## 2019-07-22 ENCOUNTER — Telehealth: Payer: Self-pay

## 2019-07-22 DIAGNOSIS — J329 Chronic sinusitis, unspecified: Secondary | ICD-10-CM

## 2019-07-22 MED ORDER — PREDNISONE 50 MG PO TABS: 50.0000 mg | ORAL_TABLET | Freq: Every day | ORAL | 0 refills | Status: DC

## 2019-07-22 NOTE — Telephone Encounter (Signed)
Pt had a VV on 07/17/2019 and was dx with sinobronchitis. Pt was instructed TCB if not better after 4-5 days. Pt states she is doing some better, still has a productive cough, chest tightness, and her temp ranges between 98-mid 90's. Please advise.

## 2019-07-22 NOTE — Telephone Encounter (Signed)
Will add Prednisone 50mg  daily for 5 days. Prescription sent to pharmacy. Continue the Augmentin until its finished. Cough may linger, sometimes for several weeks. This is a normal response to upper respiratory infections and can be managed with as needed cough medications.

## 2019-07-23 NOTE — Telephone Encounter (Signed)
Pt aware. No further questions or concerns at this time.

## 2019-08-07 ENCOUNTER — Other Ambulatory Visit: Payer: Self-pay | Admitting: Family Medicine

## 2019-08-07 DIAGNOSIS — K219 Gastro-esophageal reflux disease without esophagitis: Secondary | ICD-10-CM

## 2019-10-24 ENCOUNTER — Telehealth: Payer: Self-pay | Admitting: Family Medicine

## 2019-10-24 DIAGNOSIS — Z1231 Encounter for screening mammogram for malignant neoplasm of breast: Secondary | ICD-10-CM

## 2019-10-24 NOTE — Telephone Encounter (Signed)
Task completed. Pt has been updated of yearly screenings. Mammogram has been ordered. Pt will stop by the office to pick up fecal cards. No other inquiries during the call.

## 2019-10-24 NOTE — Telephone Encounter (Signed)
Call pt: due for yearly colon can screen and mammo. Has done fecal test is past.  See if ok to do that again or if would like to do cologuard.

## 2019-10-29 ENCOUNTER — Other Ambulatory Visit: Payer: Self-pay | Admitting: *Deleted

## 2019-10-29 DIAGNOSIS — Z1211 Encounter for screening for malignant neoplasm of colon: Secondary | ICD-10-CM

## 2019-10-29 LAB — POC HEMOCCULT BLD/STL (HOME/3-CARD/SCREEN)
Card #2 Fecal Occult Blod, POC: NEGATIVE
Card #3 Fecal Occult Blood, POC: NEGATIVE
Fecal Occult Blood, POC: NEGATIVE

## 2019-11-04 ENCOUNTER — Encounter: Payer: Self-pay | Admitting: Family Medicine

## 2019-11-04 ENCOUNTER — Telehealth (INDEPENDENT_AMBULATORY_CARE_PROVIDER_SITE_OTHER): Payer: BC Managed Care – PPO | Admitting: Family Medicine

## 2019-11-04 VITALS — Ht 64.0 in | Wt 133.0 lb

## 2019-11-04 DIAGNOSIS — K219 Gastro-esophageal reflux disease without esophagitis: Secondary | ICD-10-CM | POA: Diagnosis not present

## 2019-11-04 DIAGNOSIS — F430 Acute stress reaction: Secondary | ICD-10-CM

## 2019-11-04 DIAGNOSIS — F4321 Adjustment disorder with depressed mood: Secondary | ICD-10-CM | POA: Insufficient documentation

## 2019-11-04 MED ORDER — OMEPRAZOLE 20 MG PO CPDR: 20.0000 mg | DELAYED_RELEASE_CAPSULE | Freq: Every day | ORAL | 3 refills | Status: DC

## 2019-11-04 MED ORDER — MELOXICAM 15 MG PO TABS: ORAL_TABLET | ORAL | 4 refills | Status: DC

## 2019-11-04 MED ORDER — CLONAZEPAM 1 MG PO TABS: 1.0000 mg | ORAL_TABLET | Freq: Two times a day (BID) | ORAL | 1 refills | Status: DC | PRN

## 2019-11-04 NOTE — Assessment & Plan Note (Signed)
Would like to have her omeprazole refilled.  New prescription sent to pharmacy.

## 2019-11-04 NOTE — Assessment & Plan Note (Signed)
We discussed using a short supply of clonazepam sparingly.  Okay to take a half a tab during the day and a whole tab at night to help her rest.  Did warn about potential for dependency.  When she returns in a couple weeks if we need to do something more long-term we can consider starting an SSRI at that time.  She still uses trazodone for sleep so she does need to be careful mixing any kind of sedatives together.  Can also discuss further therapy and or counseling when she returns if she feels like this would be helpful

## 2019-11-04 NOTE — Progress Notes (Signed)
Virtual Visit via Video Note  I connected with Desiree Hughes on 11/04/19 at 10:10 AM EDT by a video enabled telemedicine application and verified that I am speaking with the correct person using two identifiers.   I discussed the limitations of evaluation and management by telemedicine and the availability of in person appointments. The patient expressed understanding and agreed to proceed.  Patient location: at home Provider location: in office  Subjective:    CC: Grief  HPI:  64 year old female is here for a virtual visit today to discuss grief.  She has been under a lot of stress recently trying to sell her home in [place] and helping take care of her adult son who has diabetes.Marland Kitchen  Her brother has also been in bad health and in fact they were trying to get him moved here with them unfortunately he recently passed away she had tried to call him around July 4 and he did not answer.  She tried to call him again the next day and he still did not answer and at that point she became concerned and hydra police do a welfare check and found that he was deceased.  Evidently he was living in bad conditions with a lot of trash and evidently was a Chart[employer] loss adjuster.  She has been upset and wishing that she had intervened a little sooner.  She will be traveling to [facility] to help go through some of his things.  Unfortunately he did not have a well. She will be flying out tomorrow.  Years ago she had actually taken lorazepam as needed when she was feeling stressed after she first moved here.  She is not currently on any type of anxiety or antidepressant medication.    Past medical history, Surgical history, Family history not pertinant except as noted below, Social history, Allergies, and medications have been entered into the medical record, reviewed, and corrections made.   Review of Systems: No fevers, chills, night sweats, weight loss, chest pain, or shortness of breath.   Objective:    General: Speaking  clearly in complete sentences without any shortness of breath.  Alert and oriented x3.  Normal judgment. No apparent acute distress.    Impression and Recommendations:    GERD (gastroesophageal reflux disease) Would like to have her omeprazole refilled.  New prescription sent to pharmacy.  Grief We discussed using a short supply of clonazepam sparingly.  Okay to take a half a tab during the day and a whole tab at night to help her rest.  Did warn about potential for dependency.  When she returns in a couple weeks if we need to do something more long-term we can consider starting an SSRI at that time.  She still uses trazodone for sleep so she does need to be careful mixing any kind of sedatives together.  Can also discuss further therapy and or counseling when she returns if she feels like this would be helpful      Time spent in encounter 22 minutes  I discussed the assessment and treatment plan with the patient. The patient was provided an opportunity to ask questions and all were answered. The patient agreed with the plan and demonstrated an understanding of the instructions.   The patient was advised to call back or seek an in-person evaluation if the symptoms worsen or if the condition fails to improve as anticipated.   Nani Gasser, MD

## 2019-11-04 NOTE — Progress Notes (Signed)
Pt reports that her brother was 65 YO and was battling depression/COVID and she blames herself for not helping or doing more.

## 2019-11-06 ENCOUNTER — Ambulatory Visit: Payer: BC Managed Care – PPO

## 2019-12-03 ENCOUNTER — Ambulatory Visit (INDEPENDENT_AMBULATORY_CARE_PROVIDER_SITE_OTHER): Payer: BC Managed Care – PPO

## 2019-12-03 ENCOUNTER — Other Ambulatory Visit: Payer: Self-pay

## 2019-12-03 DIAGNOSIS — Z1231 Encounter for screening mammogram for malignant neoplasm of breast: Secondary | ICD-10-CM

## 2020-01-10 ENCOUNTER — Emergency Department
Admission: EM | Admit: 2020-01-10 | Discharge: 2020-01-10 | Disposition: A | Discharge status: Home / Self Care | Payer: BC Managed Care – PPO | Source: Home / Self Care

## 2020-01-10 ENCOUNTER — Other Ambulatory Visit: Payer: Self-pay

## 2020-01-10 ENCOUNTER — Encounter: Payer: Self-pay | Admitting: Family Medicine

## 2020-01-10 DIAGNOSIS — Z23 Encounter for immunization: Secondary | ICD-10-CM

## 2020-01-10 DIAGNOSIS — S61212A Laceration without foreign body of right middle finger without damage to nail, initial encounter: Secondary | ICD-10-CM

## 2020-01-10 MED ORDER — TETANUS-DIPHTH-ACELL PERTUSSIS 5-2.5-18.5 LF-MCG/0.5 IM SUSP
0.5000 mL | Freq: Once | INTRAMUSCULAR | Status: AC
  Administered 2020-01-10: 0.5 mL via INTRAMUSCULAR

## 2020-01-10 NOTE — Discharge Instructions (Addendum)
Keep the finger dry for 24 hours and then you can bathe is normally you do.  Sutures should come out in 7 to 10 days.  Return if you start seeing redness or swelling or increasing pain.

## 2020-01-10 NOTE — ED Provider Notes (Signed)
[facility]    CSN: [id number] Arrival date & time: 01/10/20  1503      History   Chief Complaint Chief Complaint  Patient presents with  . Laceration    left middle finger    HPI Desiree Hughes is a 64 y.o. female.   This is a 64 year old woman who is an established [facility] patient and presents with a left hand laceration.  Last tetanus shot was 2015     Past Medical History:  Diagnosis Date  . Herniated disc, cervical   . Hypertension     Patient Active Problem List   Diagnosis Date Noted  . Grief 11/04/2019  . Insomnia 02/08/2016  . History of anxiety 12/29/2014  . GERD (gastroesophageal reflux disease) 10/07/2014  . Chronic pain syndrome 10/07/2014  . Carpal tunnel syndrome 09/22/2014  . Obstructive sleep apnea 09/22/2014  . Psoriasis 09/22/2014    Past Surgical History:  Procedure Laterality Date  . BREAST CYST ASPIRATION Left   . TUBAL LIGATION      OB History   No obstetric history on file.      Home Medications    Prior to Admission medications   Medication Sig Start Date End Date Taking? Authorizing Provider  AMBULATORY NON FORMULARY MEDICATION Medication Name: Needs new headgear, mask, tubing for her CPAP machine.  Please order through [facility] 09/18/17   Agapito Games, MD  AMBULATORY NON FORMULARY MEDICATION Contact [facility] for download from her CPAP for the last 30 days 03/25/18   Agapito Games, MD  B Complex-C (SUPER B COMPLEX PO) Take by mouth.    [provider]  BIOTIN PO Take by mouth.    [provider]  CALCIUM PO Take by mouth.    [provider]  Cholecalciferol (VITAMIN D-3 PO) Take by mouth.    [provider]  clonazePAM (KLONOPIN) 1 MG tablet Take 1 tablet (1 mg total) by mouth 2 (two) times daily as needed for anxiety. 11/04/19   Agapito Games, MD  Flax OIL Take by mouth.    [provider]  fluticasone (FLONASE) 50 MCG/ACT nasal spray Place  1 spray daily into both nostrils. 03/09/17   Agapito Games, MD  MELATONIN PO Take by mouth.    [provider]  meloxicam (MOBIC) 15 MG tablet TAKE 1 TABLET DAILY EVERY MORNING WITH FOOD 11/04/19   Agapito Games, MD  MILK THISTLE PO Take by mouth.    [provider]  Multiple Vitamins-Minerals (CENTRUM SILVER ULTRA WOMENS PO) Take by mouth.    [provider]  Omega-3 Fatty Acids (FISH OIL PO) Take by mouth.    [provider]  omeprazole (PRILOSEC) 20 MG capsule Take 1 capsule (20 mg total) by mouth daily. 11/04/19   Agapito Games, MD  traZODone (DESYREL) 50 MG tablet TAKE 3 TABLETS AT BEDTIME 02/10/19   Agapito Games, MD  vitamin C (ASCORBIC ACID) 500 MG tablet Take 500 mg by mouth daily.    [provider]    Family History Family History  Problem Relation Age of Onset  . Alcohol abuse Father   . Cancer Father   . Heart disease Father   . Diabetes Father   . Hyperlipidemia Father   . Hypertension Father   . Heart disease Brother   . Asthma Brother   . Depression Son   . Diabetes Son   . Diabetes Mother   . Hyperlipidemia Mother   .  Hypertension Mother     Social History Social History   Tobacco Use  . Smoking status: Never Smoker  . Smokeless tobacco: Never Used  Vaping Use  . Vaping Use: Never used  Substance Use Topics  . Alcohol use: No    Alcohol/week: 0.0 standard drinks  . Drug use: No     Allergies   Lisinopril   Review of Systems Review of Systems   Physical Exam Triage Vital Signs ED Triage Vitals  Enc Vitals Group     BP      Pulse      Resp      Temp      Temp src      SpO2      Weight      Height      Head Circumference      Peak Flow      Pain Score      Pain Loc      Pain Edu?      Excl. in GC?    No data found.  Updated Vital Signs BP 128/76 (BP Location: Right Arm)   Pulse 73   Temp 98.9 F (37.2 C) (Oral)   Resp 15   SpO2 97%   Physical  Exam Vitals and nursing note reviewed.  Constitutional:      General: She is not in acute distress.    Appearance: Normal appearance. She is normal weight. She is not ill-appearing.  Pulmonary:     Effort: Pulmonary effort is normal.  Musculoskeletal:        General: Signs of injury present.     Cervical back: Normal range of motion and neck supple.  Skin:    Comments: 2.6 cm lac over PIP dorsally  Neurological:     Mental Status: She is alert.      UC Treatments / Results  Labs (all labs ordered are listed, but only abnormal results are displayed) Labs Reviewed - No data to display  EKG   Radiology No results found.  Procedures Laceration Repair  Date/Time: 01/10/2020 3:52 PM Performed by: Elvina Sidle, MD Authorized by: Elvina Sidle, MD   Consent:    Consent obtained:  Verbal   Consent given by:  Patient   Risks discussed:  Pain   Alternatives discussed:  No treatment Anesthesia (see MAR for exact dosages):    Anesthesia method:  Local infiltration   Local anesthetic:  Lidocaine 1% w/o epi Laceration details:    Location:  Finger   Finger location:  R long finger   Length (cm):  2.6   Depth (mm):  4 Repair type:    Repair type:  Simple Pre-procedure details:    Preparation:  Patient was prepped and draped in usual sterile fashion Exploration:    Wound exploration: wound explored through full range of motion     Wound extent: no tendon damage noted     Contaminated: no   Treatment:    Area cleansed with:  Hibiclens   Amount of cleaning:  Standard   Visualized foreign bodies/material removed: no   Skin repair:    Repair method:  Sutures   Suture size:  5-0   Suture material:  Nylon   Suture technique:  Horizontal mattress   Number of sutures:  3 Approximation:    Approximation:  Close Post-procedure details:    Dressing:  Bulky dressing   Patient tolerance of procedure:  Tolerated well, no immediate complications   (including critical  care time)  Medications Ordered in UC Medications  Tdap (BOOSTRIX) injection 0.5 mL (0.5 mLs Intramuscular Given 01/10/20 1530)    Initial Impression / Assessment and Plan / UC Course  I have reviewed the triage vital signs and the nursing notes.  Pertinent labs & imaging results that were available during my care of the patient were reviewed by me and considered in my medical decision making (see chart for details).    Final Clinical Impressions(s) / UC Diagnoses   Final diagnoses:  Laceration of right middle finger without foreign body without damage to nail, initial encounter     Discharge Instructions     Keep the finger dry for 24 hours and then you can bathe is normally you do.  Sutures should come out in 7 to 10 days.  Return if you start seeing redness or swelling or increasing pain.    ED Prescriptions    None     I have reviewed the PDMP during this encounter.   Elvina Sidle, MD 01/10/20 1554

## 2020-01-10 NOTE — ED Triage Notes (Signed)
Laceration to middle finger (left hand) w/ new box cutter Coban dressing in place - not actively bleeding Cap refill intact Tdap 2015

## 2020-01-19 ENCOUNTER — Emergency Department
Admission: EM | Admit: 2020-01-19 | Discharge: 2020-01-19 | Disposition: A | Discharge status: Home / Self Care | Payer: BC Managed Care – PPO | Source: Home / Self Care

## 2020-01-19 ENCOUNTER — Other Ambulatory Visit: Payer: Self-pay

## 2020-01-19 DIAGNOSIS — Z4802 Encounter for removal of sutures: Secondary | ICD-10-CM

## 2020-01-19 NOTE — ED Triage Notes (Signed)
Patient here for suture removal, 3 on left middle finger.

## 2020-02-03 ENCOUNTER — Other Ambulatory Visit: Payer: Self-pay | Admitting: Family Medicine

## 2020-09-16 ENCOUNTER — Encounter: Payer: Self-pay | Admitting: Family Medicine

## 2020-09-16 ENCOUNTER — Other Ambulatory Visit: Payer: Self-pay

## 2020-09-16 ENCOUNTER — Ambulatory Visit (INDEPENDENT_AMBULATORY_CARE_PROVIDER_SITE_OTHER): Payer: BC Managed Care – PPO | Admitting: Family Medicine

## 2020-09-16 ENCOUNTER — Ambulatory Visit (INDEPENDENT_AMBULATORY_CARE_PROVIDER_SITE_OTHER): Payer: BC Managed Care – PPO

## 2020-09-16 VITALS — BP 118/64 | HR 62 | Ht 64.0 in | Wt 145.0 lb

## 2020-09-16 DIAGNOSIS — G8929 Other chronic pain: Secondary | ICD-10-CM

## 2020-09-16 DIAGNOSIS — M25541 Pain in joints of right hand: Secondary | ICD-10-CM | POA: Diagnosis not present

## 2020-09-16 DIAGNOSIS — Z0000 Encounter for general adult medical examination without abnormal findings: Secondary | ICD-10-CM | POA: Diagnosis not present

## 2020-09-16 DIAGNOSIS — K219 Gastro-esophageal reflux disease without esophagitis: Secondary | ICD-10-CM

## 2020-09-16 DIAGNOSIS — J301 Allergic rhinitis due to pollen: Secondary | ICD-10-CM | POA: Diagnosis not present

## 2020-09-16 DIAGNOSIS — L409 Psoriasis, unspecified: Secondary | ICD-10-CM

## 2020-09-16 DIAGNOSIS — M542 Cervicalgia: Secondary | ICD-10-CM

## 2020-09-16 DIAGNOSIS — M25542 Pain in joints of left hand: Secondary | ICD-10-CM | POA: Diagnosis not present

## 2020-09-16 DIAGNOSIS — Z13220 Encounter for screening for lipoid disorders: Secondary | ICD-10-CM

## 2020-09-16 DIAGNOSIS — K2100 Gastro-esophageal reflux disease with esophagitis, without bleeding: Secondary | ICD-10-CM

## 2020-09-16 DIAGNOSIS — Z23 Encounter for immunization: Secondary | ICD-10-CM

## 2020-09-16 DIAGNOSIS — F4321 Adjustment disorder with depressed mood: Secondary | ICD-10-CM

## 2020-09-16 DIAGNOSIS — F411 Generalized anxiety disorder: Secondary | ICD-10-CM

## 2020-09-16 DIAGNOSIS — F5101 Primary insomnia: Secondary | ICD-10-CM

## 2020-09-16 DIAGNOSIS — M19042 Primary osteoarthritis, left hand: Secondary | ICD-10-CM | POA: Diagnosis not present

## 2020-09-16 DIAGNOSIS — M19041 Primary osteoarthritis, right hand: Secondary | ICD-10-CM | POA: Diagnosis not present

## 2020-09-16 NOTE — Progress Notes (Signed)
Subjective:     Desiree Hughes is a 65 y.o. female and is here for a comprehensive physical exam. The patient reports problems - see note below.  She has several different concerns today.  Her weight is up.  In fact she is up about 7 pounds since December.  She has had a lot going on in her life and just reports she has mostly been stress eating and knows it is starting to really affect her.  She also complains of a lot of chronic neck pain that oftentimes radiates down into her shoulder particularly on that left side.  She has done regular exercises and stretches she is done formal physical therapy and she still continue to do her exercises she is used a TENS unit.  She did not have a good experience with dry needling so does not want to do that again.  She does have a history of psoriasis and wonders if she could have psoriatic arthritis.  She also reports a lot of pain and stiffness in her hands.  No distinct joint swelling but says sometimes they almost lock especially in the mornings.  Her biggest stress lately is been dealing with her brother's recent passing and then dealing with a lot of his affairs.  Social History   Socioeconomic History  . Marital status: Married    Spouse name: Not on file  . Number of children: Not on file  . Years of education: Not on file  . Highest education level: Not on file  Occupational History  . Not on file  Tobacco Use  . Smoking status: Never Smoker  . Smokeless tobacco: Never Used  Vaping Use  . Vaping Use: Never used  Substance and Sexual Activity  . Alcohol use: No    Alcohol/week: 0.0 standard drinks  . Drug use: No  . Sexual activity: Never  Other Topics Concern  . Not on file  Social History Narrative  . Not on file   Social Determinants of Health   Financial Resource Strain: Not on file  Food Insecurity: Not on file  Transportation Needs: Not on file  Physical Activity: Not on file  Stress: Not on file  Social Connections: Not  on file  Intimate Partner Violence: Not on file   Health Maintenance  Topic Date Due  . Zoster Vaccines- Shingrix (1 of 2) 08/11/2004  . DEXA SCAN  Never done  . PNA vac Low Risk Adult (1 of 2 - PCV13) 08/12/2019  . COVID-19 Vaccine (4 - Booster for Pfizer series) 06/05/2020  . COLON CANCER SCREENING ANNUAL FOBT  10/28/2020  . INFLUENZA VACCINE  11/22/2020  . MAMMOGRAM  12/02/2021  . TETANUS/TDAP  01/09/2030  . Hepatitis C Screening  Completed  . HPV VACCINES  Aged Out  . COLONOSCOPY (Pts 45-27yrs Insurance coverage will need to be confirmed)  Discontinued    The following portions of the patient's history were reviewed and updated as appropriate: allergies, current medications, past family history, past medical history, past social history, past surgical history and problem list.  Review of Systems A comprehensive review of systems was negative.   Objective:    BP 118/64   Pulse 62   Ht 5\' 4"  (1.626 m)   Wt 145 lb (65.8 kg)   SpO2 100%   BMI 24.89 kg/m  General appearance: alert, cooperative and appears stated age Head: Normocephalic, without obvious abnormality, atraumatic Eyes: conj clear, EOMI, PEERLA Ears: normal TM's and external ear canals both ears Nose: Nares  normal. Septum midline. Mucosa normal. No drainage or sinus tenderness. Throat: lips, mucosa, and tongue normal; teeth and gums normal Neck: no adenopathy, no carotid bruit, no JVD, supple, symmetrical, trachea midline and thyroid not enlarged, symmetric, no tenderness/mass/nodules Back: symmetric, no curvature. ROM normal. No CVA tenderness. Lungs: clear to auscultation bilaterally Breasts: normal appearance, no masses or tenderness Heart: regular rate and rhythm, S1, S2 normal, no murmur, click, rub or gallop Abdomen: soft, non-tender; bowel sounds normal; no masses,  no organomegaly Extremities: extremities normal, atraumatic, no cyanosis or edema Pulses: 2+ and symmetric Skin: Skin color, texture,  turgor normal. No rashes or lesions Lymph nodes: Cervical, supraclavicular, and axillary nodes normal. Neurologic: Alert and oriented X 3, normal strength and tone. Normal symmetric reflexes. Normal coordination and gait    Assessment:    Healthy female exam.      Plan:     See After Visit Summary for Counseling Recommendations    Keep up a regular exercise program and make sure you are eating a healthy diet Try to eat 4 servings of dairy a day, or if you are lactose intolerant take a calcium with vitamin D daily.  Your vaccines are up to date.   Prevnar 20 given today.  Shingles vaccine given today.  Chronic cervical pain Is already tried physical therapy, home exercises, TENS unit etc.  She says the pain gets so bad at times that she is actually tearful.  She would like to have something to take when it gets really bad such as possibly tramadol.  We discussed referral to orthopedist as well especially if it is getting to the point where her pain has really increased.  Would also like to start by going ahead and getting some plain film x-rays today.  Psoriasis We will do some additional labs to see if there could be some inflammation consistent with psoriatic arthritis.  We can always consult rheumatology if needed she is starting to get significant pain in her hand joints as well.  Grief Reef-doing okay with her brothers passing but still struggling some it has really increased her stress and triggered some stress eating.  I am happy to refer her for therapy/counseling if she would like.  She can always just let me know.   Meds ordered this encounter  Medications  . clonazePAM (KLONOPIN) 1 MG tablet    Sig: Take 1 tablet (1 mg total) by mouth daily as needed for anxiety.    Dispense:  30 tablet    Refill:  1  . traZODone (DESYREL) 50 MG tablet    Sig: Take 3 tablets (150 mg total) by mouth at bedtime.    Dispense:  270 tablet    Refill:  3  . fluticasone (FLONASE) 50 MCG/ACT  nasal spray    Sig: Place 1 spray into both nostrils daily.    Dispense:  48 g    Refill:  3  . nystatin-triamcinolone ointment (MYCOLOG)    Sig: Apply 1 application topically 2 (two) times daily.    Dispense:  30 g    Refill:  prn  . omeprazole (PRILOSEC) 20 MG capsule    Sig: Take 1 capsule (20 mg total) by mouth daily.    Dispense:  90 capsule    Refill:  3

## 2020-09-16 NOTE — Patient Instructions (Signed)

## 2020-09-17 DIAGNOSIS — G8929 Other chronic pain: Secondary | ICD-10-CM | POA: Insufficient documentation

## 2020-09-17 MED ORDER — NYSTATIN-TRIAMCINOLONE 100000-0.1 UNIT/GM-% EX OINT
1.0000 | TOPICAL_OINTMENT | Freq: Two times a day (BID) | CUTANEOUS | 99 refills | Status: DC
Start: 2020-09-17 — End: 2021-09-06

## 2020-09-17 MED ORDER — OMEPRAZOLE 20 MG PO CPDR: 20.0000 mg | DELAYED_RELEASE_CAPSULE | Freq: Every day | ORAL | 3 refills | Status: DC

## 2020-09-17 MED ORDER — FLUTICASONE PROPIONATE 50 MCG/ACT NA SUSP: 1.0000 | Freq: Every day | NASAL | 3 refills | Status: DC

## 2020-09-17 MED ORDER — CLONAZEPAM 1 MG PO TABS: 1.0000 mg | ORAL_TABLET | Freq: Every day | ORAL | 1 refills | Status: DC | PRN

## 2020-09-17 MED ORDER — TRAZODONE HCL 50 MG PO TABS: 150.0000 mg | ORAL_TABLET | Freq: Every day | ORAL | 3 refills | Status: DC

## 2020-09-17 NOTE — Assessment & Plan Note (Signed)
We will do some additional labs to see if there could be some inflammation consistent with psoriatic arthritis.  We can always consult rheumatology if needed she is starting to get significant pain in her hand joints as well.

## 2020-09-17 NOTE — Assessment & Plan Note (Addendum)
Is already tried physical therapy, home exercises, TENS unit etc.  She says the pain gets so bad at times that she is actually tearful.  She would like to have something to take when it gets really bad such as possibly tramadol.  We discussed referral to orthopedist as well especially if it is getting to the point where her pain has really increased.  Would also like to start by going ahead and getting some plain film x-rays today.

## 2020-09-17 NOTE — Assessment & Plan Note (Signed)
Reef-doing okay with her brothers passing but still struggling some it has really increased her stress and triggered some stress eating.  I am happy to refer her for therapy/counseling if she would like.  She can always just let me know.

## 2020-09-21 LAB — LIPID PANEL W/REFLEX DIRECT LDL
Cholesterol: 214 mg/dL — ABNORMAL HIGH (ref <200)
HDL: 88 mg/dL (ref >=50)
LDL Cholesterol (Calc): 110 mg/dL (calc) — ABNORMAL HIGH
Non-HDL Cholesterol (Calc): 126 mg/dL (calc) (ref <130)
Total CHOL/HDL Ratio: 2.4 (calc) (ref <5.0)
Triglycerides: 70 mg/dL (ref <150)

## 2020-09-21 LAB — COMPLETE METABOLIC PANEL WITH GFR
AG Ratio: 1.8 (calc) (ref 1.0–2.5)
ALT: 15 U/L (ref 6–29)
AST: 15 U/L (ref 10–35)
Albumin: 4.6 g/dL (ref 3.6–5.1)
Alkaline phosphatase (APISO): 43 U/L (ref 37–153)
BUN: 14 mg/dL (ref 7–25)
CO2: 30 mmol/L (ref 20–32)
Calcium: 9.8 mg/dL (ref 8.6–10.4)
Chloride: 102 mmol/L (ref 98–110)
Creat: 0.75 mg/dL (ref 0.50–0.99)
GFR, Est African American: 96 mL/min/{1.73_m2} (ref >=60)
GFR, Est Non African American: 83 mL/min/{1.73_m2} (ref >=60)
Globulin: 2.6 g/dL (calc) (ref 1.9–3.7)
Glucose, Bld: 88 mg/dL (ref 65–99)
Potassium: 3.9 mmol/L (ref 3.5–5.3)
Sodium: 140 mmol/L (ref 135–146)
Total Bilirubin: 0.5 mg/dL (ref 0.2–1.2)
Total Protein: 7.2 g/dL (ref 6.1–8.1)

## 2020-09-21 LAB — CBC
HCT: 37.9 % (ref 35.0–45.0)
Hemoglobin: 12.4 g/dL (ref 11.7–15.5)
MCH: 32.1 pg (ref 27.0–33.0)
MCHC: 32.7 g/dL (ref 32.0–36.0)
MCV: 98.2 fL (ref 80.0–100.0)
MPV: 10.4 fL (ref 7.5–12.5)
Platelets: 230 10*3/uL (ref 140–400)
RBC: 3.86 10*6/uL (ref 3.80–5.10)
RDW: 12.2 % (ref 11.0–15.0)
WBC: 4.5 10*3/uL (ref 3.8–10.8)

## 2020-09-21 LAB — C-REACTIVE PROTEIN: CRP: 0.7 mg/L (ref <8.0)

## 2020-09-21 LAB — CYCLIC CITRUL PEPTIDE ANTIBODY, IGG: Cyclic Citrullin Peptide Ab: <16 UNITS

## 2020-09-21 LAB — TSH: TSH: 1.38 mIU/L (ref 0.40–4.50)

## 2020-09-21 LAB — RHEUMATOID FACTOR: Rheumatoid fact SerPl-aCnc: <14 IU/mL (ref <14)

## 2020-09-21 LAB — ANA: Anti Nuclear Antibody (ANA): NEGATIVE

## 2020-09-21 LAB — HLA-B27 ANTIGEN: HLA-B27 Antigen: NEGATIVE

## 2020-09-21 LAB — SEDIMENTATION RATE: Sed Rate: 9 mm/h (ref 0–30)

## 2020-09-23 DIAGNOSIS — M542 Cervicalgia: Secondary | ICD-10-CM | POA: Diagnosis not present

## 2020-09-23 DIAGNOSIS — M47812 Spondylosis without myelopathy or radiculopathy, cervical region: Secondary | ICD-10-CM | POA: Diagnosis not present

## 2020-09-24 ENCOUNTER — Other Ambulatory Visit: Payer: Self-pay | Admitting: *Deleted

## 2020-09-24 DIAGNOSIS — L409 Psoriasis, unspecified: Secondary | ICD-10-CM

## 2020-09-30 DIAGNOSIS — M47812 Spondylosis without myelopathy or radiculopathy, cervical region: Secondary | ICD-10-CM | POA: Diagnosis not present

## 2020-10-07 DIAGNOSIS — M47812 Spondylosis without myelopathy or radiculopathy, cervical region: Secondary | ICD-10-CM | POA: Diagnosis not present

## 2020-10-07 DIAGNOSIS — G5603 Carpal tunnel syndrome, bilateral upper limbs: Secondary | ICD-10-CM | POA: Diagnosis not present

## 2020-10-07 DIAGNOSIS — M4802 Spinal stenosis, cervical region: Secondary | ICD-10-CM | POA: Diagnosis not present

## 2020-10-20 DIAGNOSIS — M4802 Spinal stenosis, cervical region: Secondary | ICD-10-CM | POA: Diagnosis not present

## 2020-10-20 DIAGNOSIS — M47812 Spondylosis without myelopathy or radiculopathy, cervical region: Secondary | ICD-10-CM | POA: Diagnosis not present

## 2020-10-28 ENCOUNTER — Other Ambulatory Visit: Payer: Self-pay

## 2020-10-28 ENCOUNTER — Ambulatory Visit: Payer: BC Managed Care – PPO | Admitting: Family Medicine

## 2020-10-28 VITALS — BP 114/75 | HR 71 | Wt 146.0 lb

## 2020-10-28 DIAGNOSIS — G5603 Carpal tunnel syndrome, bilateral upper limbs: Secondary | ICD-10-CM

## 2020-10-28 DIAGNOSIS — L409 Psoriasis, unspecified: Secondary | ICD-10-CM

## 2020-10-28 DIAGNOSIS — M25542 Pain in joints of left hand: Secondary | ICD-10-CM

## 2020-10-28 DIAGNOSIS — M47812 Spondylosis without myelopathy or radiculopathy, cervical region: Secondary | ICD-10-CM | POA: Diagnosis not present

## 2020-10-28 DIAGNOSIS — F4321 Adjustment disorder with depressed mood: Secondary | ICD-10-CM

## 2020-10-28 DIAGNOSIS — M25541 Pain in joints of right hand: Secondary | ICD-10-CM

## 2020-10-28 MED ORDER — MELOXICAM 15 MG PO TABS: ORAL_TABLET | ORAL | 4 refills | Status: DC

## 2020-10-28 NOTE — Assessment & Plan Note (Signed)
Upon the 1 year anniversary of her brother's death she will be flying back out to the [facility] for his celebration of life will be out there for 2 weeks.  She knows it will be stressful but hopefully she will get through it okay.

## 2020-10-28 NOTE — Progress Notes (Signed)
Established Patient Office Visit  Subjective:  Patient ID: Desiree Hughes, female    DOB: 1955/09/04  Age: 65 y.o. MRN: [id number]  CC:  Chief Complaint  Patient presents with   Neck Pain    HPI Desiree Hughes presents for Neck pain.  She did end up seeing the orthopedist and had an MRI and actually recently underwent a spinal injection on June 20 night.  She says she has had some major improvement in her pain and discomfort she is no longer waking up with severe debilitating headaches.  She still has a lot of soreness and tenderness though and if she overdoes it she cannot really feel it such as cleaning the house and things like that.  She is still experiencing numbness and tingling in both hands and wrists.  She has cock up splints at home but is not currently using them  She had also complained of just generalized joint pain in her hip spine etc. we placed a referral to rheumatology.  They did accept a referral and she has an upcoming appointment.  She will be flying back to [place] state in the next couple weeks today was the 1 year anniversary of the death of her brother there can have a celebration of life  Past Medical History:  Diagnosis Date   Herniated disc, cervical    Hypertension     Past Surgical History:  Procedure Laterality Date   BREAST CYST ASPIRATION Left    TUBAL LIGATION      Family History  Problem Relation Age of Onset   Alcohol abuse Father    Cancer Father    Heart disease Father    Diabetes Father    Hyperlipidemia Father    Hypertension Father    Heart disease Brother    Asthma Brother    Depression Son    Diabetes Son    Diabetes Mother    Hyperlipidemia Mother    Hypertension Mother     Social History   Socioeconomic History   Marital status: Married    Spouse name: Not on file   Number of children: Not on file   Years of education: Not on file   Highest education level: Not on file  Occupational History   Not on file  Tobacco  Use   Smoking status: Never   Smokeless tobacco: Never  Vaping Use   Vaping Use: Never used  Substance and Sexual Activity   Alcohol use: No    Alcohol/week: 0.0 standard drinks   Drug use: No   Sexual activity: Never  Other Topics Concern   Not on file  Social History Narrative   Not on file   Social Determinants of Health   Financial Resource Strain: Not on file  Food Insecurity: Not on file  Transportation Needs: Not on file  Physical Activity: Not on file  Stress: Not on file  Social Connections: Not on file  Intimate Partner Violence: Not on file    Outpatient Medications Prior to Visit  Medication Sig Dispense Refill   AMBULATORY NON FORMULARY MEDICATION Medication Name: Needs new headgear, mask, tubing for her CPAP machine.  Please order through [facility] 1 vial PRN   AMBULATORY NON FORMULARY MEDICATION Contact [facility] for download from her CPAP for the last 30 days 1 vial 0   B Complex-C (SUPER B COMPLEX PO) Take by mouth.     BIOTIN PO Take by mouth.     CALCIUM PO Take by mouth.  Cholecalciferol (VITAMIN D-3 PO) Take by mouth.     clonazePAM (KLONOPIN) 1 MG tablet Take 1 tablet (1 mg total) by mouth daily as needed for anxiety. 30 tablet 1   Flax OIL Take by mouth.     fluticasone (FLONASE) 50 MCG/ACT nasal spray Place 1 spray into both nostrils daily. 48 g 3   HYDROcodone-acetaminophen (NORCO/VICODIN) 5-325 MG tablet Take 1 tablet by mouth every 6 (six) hours as needed.     MELATONIN PO Take by mouth.     MILK THISTLE PO Take by mouth.     Multiple Vitamins-Minerals (CENTRUM SILVER ULTRA WOMENS PO) Take by mouth.     nystatin-triamcinolone ointment (MYCOLOG) Apply 1 application topically 2 (two) times daily. 30 g prn   Omega-3 Fatty Acids (FISH OIL PO) Take by mouth.     omeprazole (PRILOSEC) 20 MG capsule Take 1 capsule (20 mg total) by mouth daily. 90 capsule 3   traZODone (DESYREL) 50 MG tablet Take 3 tablets (150 mg total) by mouth at bedtime. 270 tablet 3    vitamin C (ASCORBIC ACID) 500 MG tablet Take 500 mg by mouth daily.     meloxicam (MOBIC) 15 MG tablet TAKE 1 TABLET DAILY EVERY MORNING WITH FOOD 90 tablet 4   No facility-administered medications prior to visit.    Allergies  Allergen Reactions   Lisinopril Cough    ROS Review of Systems    Objective:    Physical Exam  BP 114/75   Pulse 71   Wt 146 lb (66.2 kg)   SpO2 99%   BMI 25.06 kg/m  Wt Readings from Last 3 Encounters:  10/28/20 146 lb (66.2 kg)  09/16/20 145 lb (65.8 kg)  11/04/19 133 lb (60.3 kg)     Health Maintenance Due  Topic Date Due   DEXA SCAN  Never done   PNA vac Low Risk Adult (1 of 2 - PCV13) 08/12/2019   COVID-19 Vaccine (4 - Booster for Pfizer series) 07/03/2020   COLON CANCER SCREENING ANNUAL FOBT  10/28/2020    There are no preventive care reminders to display for this patient.  Lab Results  Component Value Date   TSH 1.38 09/16/2020   Lab Results  Component Value Date   WBC 4.5 09/16/2020   HGB 12.4 09/16/2020   HCT 37.9 09/16/2020   MCV 98.2 09/16/2020   PLT 230 09/16/2020   Lab Results  Component Value Date   NA 140 09/16/2020   K 3.9 09/16/2020   CO2 30 09/16/2020   GLUCOSE 88 09/16/2020   BUN 14 09/16/2020   CREATININE 0.75 09/16/2020   BILITOT 0.5 09/16/2020   ALKPHOS 46 08/06/2014   AST 15 09/16/2020   ALT 15 09/16/2020   PROT 7.2 09/16/2020   CALCIUM 9.8 09/16/2020   Lab Results  Component Value Date   CHOL 214 (H) 09/16/2020   Lab Results  Component Value Date   HDL 88 09/16/2020   Lab Results  Component Value Date   LDLCALC 110 (H) 09/16/2020   Lab Results  Component Value Date   TRIG 70 09/16/2020   Lab Results  Component Value Date   CHOLHDL 2.4 09/16/2020   Lab Results  Component Value Date   HGBA1C 5.2 09/24/2018      Assessment & Plan:   Problem List Items Addressed This Visit       Nervous and Auditory   Bilateral carpal tunnel syndrome    Her to start wearing her cock-up  splints again.  She had problems with this back in 2016 and is starting to flare again.  She notices it is worse if her arms are raised above her shoulder height at night.  We discussed that if with wearing the cock-up splint she is not noticing improvement in symptoms then she could consider further work-up with nerve conduction studies to see if the symptoms could be coming from her neck.         Musculoskeletal and Integument   Psoriasis    Rheumatology appointment is scheduled for later this summer.       Cervical spondylosis    We will go ahead and refill meloxicam which does provide some relief.  She is doing a little bit better after her injection.  She does have a follow-up appointment coming up soon.       Relevant Medications   HYDROcodone-acetaminophen (NORCO/VICODIN) 5-325 MG tablet   meloxicam (MOBIC) 15 MG tablet     Other   Grief    Upon the 1 year anniversary of her brother's death she will be flying back out to the [facility] for his celebration of life will be out there for 2 weeks.  She knows it will be stressful but hopefully she will get through it okay.       Other Visit Diagnoses     Arthralgia of both hands    -  Primary       Meds ordered this encounter  Medications   meloxicam (MOBIC) 15 MG tablet    Sig: TAKE 1 TABLET DAILY EVERY MORNING WITH FOOD    Dispense:  90 tablet    Refill:  4    Follow-up: No follow-ups on file.    Nani Gasser, MD

## 2020-10-28 NOTE — Assessment & Plan Note (Signed)
Her to start wearing her cock-up splints again.  She had problems with this back in 2016 and is starting to flare again.  She notices it is worse if her arms are raised above her shoulder height at night.  We discussed that if with wearing the cock-up splint she is not noticing improvement in symptoms then she could consider further work-up with nerve conduction studies to see if the symptoms could be coming from her neck.

## 2020-10-28 NOTE — Assessment & Plan Note (Signed)
We will go ahead and refill meloxicam which does provide some relief.  She is doing a little bit better after her injection.  She does have a follow-up appointment coming up soon.

## 2020-10-28 NOTE — Assessment & Plan Note (Signed)
Rheumatology appointment is scheduled for later this summer.

## 2020-11-22 NOTE — Progress Notes (Signed)
Office Visit Note  Patient: Desiree Hughes             Date of Birth: [birth year]           MRN: [id number]             PCP: Agapito Games, MD Referring: Agapito Games, * Visit Date: 11/23/2020  Subjective:  New Patient (Initial Visit) (Patient complains of left sided neck pain with radiation to left shoulder, bilateral hand pain, stiffness, and swelling. Patient recently had cervical spine injection which offered some relief. )   History of Present Illness: Desiree Hughes is a 65 y.o. female here for chronic joint pains concern for psoriatic arthritis. She had recent injection for cervical spine pain with some headache radiation with improvement. She also gets partial improvement from meloxicam and has used low dose hydrocodone PRN for pain exacerbations.  Overall her worst involved areas are in the neck with radiation to the shoulder and bilateral hands with pain and stiffness and swelling.  She most often notices swelling in the third digit of her left hand.  She intermittently notices dropping items and has occasional numbness in her fingers.  She does have a history of carpal tunnel syndrome and is planned for upcoming evaluation with nerve test this problem.  She does not notice as much trouble in her lower extremities though sometimes has some knee pain and popping.  She has a long history of skin psoriasis with involvement mild on her elbows most extensive on her anterior shins with extensive residual scarring.  She also has chronic nail pitting irregularities and longitudinal ridges that she is not sure whether definitively related to psoriasis or not.  Currently symptoms do well she typically has very minimal disease activity during the summer with lots of outdoor exposure.  Her symptoms typically worsen during the winter months.  Activities of Daily Living:  Patient reports morning stiffness for 1 hour.   Patient Reports nocturnal pain.  Difficulty dressing/grooming:  Reports Difficulty climbing stairs: Denies Difficulty getting out of chair: Denies Difficulty using hands for taps, buttons, cutlery, and/or writing: Reports  Review of Systems  Constitutional:  Positive for fatigue.  HENT:  Positive for mouth dryness. Negative for mouth sores and nose dryness.   Eyes:  Positive for itching. Negative for pain, visual disturbance and dryness.  Respiratory:  Negative for cough, hemoptysis, shortness of breath and difficulty breathing.   Cardiovascular:  Negative for chest pain, palpitations and swelling in legs/feet.  Gastrointestinal:  Negative for abdominal pain, blood in stool, constipation and diarrhea.  Endocrine: Negative for increased urination.  Genitourinary:  Negative for painful urination.  Musculoskeletal:  Positive for joint pain, joint pain, joint swelling and morning stiffness. Negative for myalgias, muscle weakness, muscle tenderness and myalgias.  Skin:  Positive for color change. Negative for rash and redness.  Allergic/Immunologic: Negative for susceptible to infections.  Neurological:  Positive for dizziness. Negative for numbness, headaches, memory loss and weakness.  Hematological:  Negative for swollen glands.  Psychiatric/Behavioral:  Positive for sleep disturbance. Negative for confusion.    PMFS History:  Patient Active Problem List   Diagnosis Date Noted   Generalized osteoarthritis 11/23/2020   Cervical spondylosis 10/28/2020   Chronic cervical pain 09/17/2020   Grief 11/04/2019   Insomnia 02/08/2016   GAD (generalized anxiety disorder) 12/29/2014   GERD (gastroesophageal reflux disease) 10/07/2014   Chronic pain syndrome 10/07/2014   Bilateral carpal tunnel syndrome 09/22/2014   Obstructive sleep apnea 09/22/2014  Psoriasis 09/22/2014    Past Medical History:  Diagnosis Date   Herniated disc, cervical    Hypertension     Family History  Problem Relation Age of Onset   Diabetes Mother    Hyperlipidemia Mother     Hypertension Mother    Alcohol abuse Father    Cancer Father    Heart disease Father    Diabetes Father    Hyperlipidemia Father    Hypertension Father    Heart disease Brother    Asthma Brother    Alcohol abuse Brother    Early death Brother    Depression Son    Diabetes Son    Healthy Son    Past Surgical History:  Procedure Laterality Date   BREAST CYST ASPIRATION Left    TUBAL LIGATION     Social History   Social History Narrative   Not on file   Immunization History  Administered Date(s) Administered   Influenza Inj Mdck Quad Pf 01/10/2019   Influenza, High Dose Seasonal PF 03/09/2017   Influenza,inj,Quad PF,6+ Mos 12/29/2014, 01/04/2016, 03/25/2018   Influenza-Unspecified 03/31/2014, 03/05/2020   PFIZER(Purple Top)SARS-COV-2 Vaccination 07/16/2019, 08/05/2019, 03/05/2020   PNEUMOCOCCAL CONJUGATE-20 09/16/2020   Tdap 03/31/2014, 01/10/2020   Zoster Recombinat (Shingrix) 09/16/2020     Objective: Vital Signs: BP 128/81 (BP Location: Right Arm, Patient Position: Sitting, Cuff Size: Normal)   Pulse 65   Ht 5' 5.5" (1.664 m)   Wt 147 lb 12.8 oz (67 kg)   BMI 24.22 kg/m    Physical Exam HENT:     Mouth/Throat:     Mouth: Mucous membranes are moist.     Pharynx: Oropharynx is clear.  Eyes:     Conjunctiva/sclera: Conjunctivae normal.  Cardiovascular:     Rate and Rhythm: Normal rate and regular rhythm.  Pulmonary:     Effort: Pulmonary effort is normal.     Breath sounds: Normal breath sounds.  Skin:    General: Skin is warm and dry.     Comments: Multiple scattered probable seborrheic keratoses Faint erythematous patches over bilateral elbows with slight skin peeling and no scale Thick longitudinal ridging of fingernails bilaterally with some divot and depression changes no visible pits Chronic hypopigmented and shiny skin patches on anterior shins probable old excoriation, few scattered erythematous patches with no overlying scale  Neurological:      General: No focal deficit present.     Mental Status: She is alert.  Psychiatric:        Mood and Affect: Mood normal.     Musculoskeletal Exam:  Neck full ROM some lateral radiation of pain with rotation no focal tenderness Shoulders full ROM no tenderness or swelling Elbows full ROM no tenderness or swelling Wrists full ROM, positive tinel sign Fingers show a full range of motion, early Heberden's nodes of DIP joints, mild triggering of the third and fourth finger on the right hand without palpable flexor tendon nodules, no palpable synovitis Knees full ROM no swelling bilateral patellofemoral crepitus present Ankles full ROM no tenderness or swelling   Investigation: No additional findings.  Imaging: No results found.  Recent Labs: Lab Results  Component Value Date   WBC 4.5 09/16/2020   HGB 12.4 09/16/2020   PLT 230 09/16/2020   NA 140 09/16/2020   K 3.9 09/16/2020   CL 102 09/16/2020   CO2 30 09/16/2020   GLUCOSE 88 09/16/2020   BUN 14 09/16/2020   CREATININE 0.75 09/16/2020   BILITOT 0.5 09/16/2020  ALKPHOS 46 08/06/2014   AST 15 09/16/2020   ALT 15 09/16/2020   PROT 7.2 09/16/2020   CALCIUM 9.8 09/16/2020   GFRAA 96 09/16/2020    Speciality Comments: No specialty comments available.  Procedures:  No procedures performed Allergies: Lisinopril   Assessment / Plan:     Visit Diagnoses: Generalized osteoarthritis Chronic cervical pain  Worst affected area is the chronic cervical pain though based on her imaging sounds like currently showing degenerative changes there as well causing this impingement.  There are diffuse osteoarthritis changes on inspected joints today there is no clear inflammatory findings.  Combined with normal serum inflammatory markers checked recently cannot confirm a diagnosis of inflammatory arthritis at this time.  She has had recent steroid injection also describes symptom improvement during the summer which is typical for psoriatic  disease.  I recommend we should follow-up in a few months during winter months more likely to demonstrate overt disease or can see her as needed for a flare of symptoms.  Agree with continuing oral NSAID medication for osteoarthritis symptoms. The finger catching in her hands probable early trigger finger. It is not extremely painful nor limiting function at this time so I recommend we can recheck in follow-up could be candidate for local steroid injection treatment.  Psoriasis  Skin disease is active but a low total body surface area involved at this time.  Nail changes are suggestive for psoriatic disease this is associated with higher rate of enthesitis so do have suspicion but no overt findings at this time.  Bilateral carpal tunnel syndrome  She states has upcoming evaluation to assess this suspect this is a most likely explanation for her dropping items or intermittent numbness and paresthesia.  Orders: No orders of the defined types were placed in this encounter.  No orders of the defined types were placed in this encounter.    Follow-Up Instructions: Return in about 4 months (around 03/25/2021) for New pt OA vs ?PsA f/u 70mos.   Fuller Plan, MD  Note - This record has been created using [employer].  Chart creation errors have been sought, but may not always  have been located. Such creation errors do not reflect on  the standard of medical care.

## 2020-11-23 ENCOUNTER — Ambulatory Visit: Payer: BC Managed Care – PPO | Admitting: Internal Medicine

## 2020-11-23 ENCOUNTER — Encounter: Payer: Self-pay | Admitting: Internal Medicine

## 2020-11-23 ENCOUNTER — Other Ambulatory Visit: Payer: Self-pay

## 2020-11-23 VITALS — BP 128/81 | HR 65 | Ht 65.5 in | Wt 147.8 lb

## 2020-11-23 DIAGNOSIS — M542 Cervicalgia: Secondary | ICD-10-CM

## 2020-11-23 DIAGNOSIS — M159 Polyosteoarthritis, unspecified: Secondary | ICD-10-CM

## 2020-11-23 DIAGNOSIS — G5603 Carpal tunnel syndrome, bilateral upper limbs: Secondary | ICD-10-CM

## 2020-11-23 DIAGNOSIS — G8929 Other chronic pain: Secondary | ICD-10-CM

## 2020-11-23 DIAGNOSIS — L409 Psoriasis, unspecified: Secondary | ICD-10-CM | POA: Diagnosis not present

## 2020-11-23 DIAGNOSIS — M170 Bilateral primary osteoarthritis of knee: Secondary | ICD-10-CM | POA: Insufficient documentation

## 2020-11-23 NOTE — Patient Instructions (Signed)
Trigger Finger  Trigger finger, also called stenosing tenosynovitis,  is a condition that causes a finger to get stuck in a bent position. Each finger has a tendon, which is a tough, cord-like tissue that connects muscle tobone, and each tendon passes through a tunnel of tissue called a tendon sheath. To move your finger, your tendon needs to glide freely through the sheath. Trigger finger happens when the tendon or the sheath thickens, making it difficult to move your finger. Trigger finger can affect any finger or a thumb. It may affect more than one finger. Mild cases may clear up with rest andmedicine. Severe cases require more treatment. What are the causes? Trigger finger is caused by a thickened finger tendon or tendon sheath. Thecause of this thickening is not known. What increases the risk? The following factors may make you more likely to develop this condition: Doing activities that require a strong grip. Having rheumatoid arthritis, gout, or diabetes. Being 40-60 years old. Being female. What are the signs or symptoms? Symptoms of this condition include: Pain when bending or straightening your finger. Tenderness or swelling where your finger attaches to the palm of your hand. A lump in the palm of your hand or on the inside of your finger. Hearing a noise like a pop or a snap when you try to straighten your finger. Feeling a catching or locking sensation when you try to straighten your finger. Being unable to straighten your finger. How is this diagnosed? This condition is diagnosed based on your symptoms and a physical exam. How is this treated? This condition may be treated by: Resting your finger and avoiding activities that make symptoms worse. Wearing a finger splint to keep your finger extended. Taking NSAIDs, such as ibuprofen, to relieve pain and swelling. Doing gentle exercises to stretch the finger as told by your health care provider. Having medicine that reduces  swelling and inflammation (steroids) injected into the tendon sheath. Injections may need to be repeated. Having surgery to open the tendon sheath. This may be done if other treatments do not work and you cannot straighten your finger. You may need physical therapy after surgery. Follow these instructions at home: If you have a splint: Wear the splint as told by your health care provider. Remove it only as told by your health care provider. Loosen it if your fingers tingle, become numb, or turn cold and blue. Keep it clean. If the splint is not waterproof: Do not let it get wet. Cover it with a watertight covering when you take a bath or shower. Managing pain, stiffness, and swelling     If directed, apply heat to the affected area as often as told by your health care provider. Use the heat source that your health care provider recommends, such as a moist heat pack or a heating pad. Place a towel between your skin and the heat source. Leave the heat on for 20-30 minutes. Remove the heat if your skin turns bright red. This is especially important if you are unable to feel pain, heat, or cold. You may have a greater risk of getting burned. If directed, put ice on the painful area. To do this: If you have a removable splint, remove it as told by your health care provider. Put ice in a plastic bag. Place a towel between your skin and the bag or between your splint and the bag. Leave the ice on for 20 minutes, 2-3 times a day.  Activity Rest your finger as   told by your health care provider. Avoid activities that make the pain worse. Return to your normal activities as told by your health care provider. Ask your health care provider what activities are safe for you. Do exercises as told by your health care provider. Ask your health care provider when it is safe to drive if you have a splint on your hand. General instructions Take over-the-counter and prescription medicines only as told by  your health care provider. Keep all follow-up visits as told by your health care provider. This is important. Contact a health care provider if: Your symptoms are not improving with home care. Summary Trigger finger, also called stenosing tenosynovitis, causes your finger to get stuck in a bent position. This can make it difficult and painful to straighten your finger. This condition develops when a finger tendon or tendon sheath thickens. Treatment may include resting your finger, wearing a splint, and taking medicines. In severe cases, surgery to open the tendon sheath may be needed. This information is not intended to replace advice given to you by your health care provider. Make sure you discuss any questions you have with your healthcare provider. Document Revised: 08/26/2018 Document Reviewed: 08/26/2018 Elsevier Patient Education  2022 [employer]  

## 2021-03-28 NOTE — Progress Notes (Signed)
Office Visit Note  Patient: Desiree Hughes             Date of Birth: 1955/08/05           MRN: [id number]             PCP: Agapito Games, MD Referring: Agapito Games, * Visit Date: 03/29/2021   Subjective:  Follow-up (Aching, low back pain)   History of Present Illness: Desiree Hughes is a 65 y.o. female here for follow up for joint pains and psoriasis on NSAIDs no inflammatory changes at initial exam. She has noticed some increased joint pains particularly with colder weather changes with some more right anterior knee pain also fingers and wrists. She reports waking with numbness into the right hand in the morning also with prolonged driving. Her right 3rd and 4th fingers are triggering more frequently sometimes affecting her dexterity. Rashes are mildly increased on distal lower extremities. More acutely she currently complains of right sided low back pain for the past 4 days after waking up one day with this. Pain is very distracting and occurs despite changing positions standing, walking, sitting, lying and some radiation towards hip and gluteal area.  Previous HPI 11/23/20 Desiree Hughes is a 65 y.o. female here for chronic joint pains concern for psoriatic arthritis. She had recent injection for cervical spine pain with some headache radiation with improvement. She also gets partial improvement from meloxicam and has used low dose hydrocodone PRN for pain exacerbations.  Overall her worst involved areas are in the neck with radiation to the shoulder and bilateral hands with pain and stiffness and swelling.  She most often notices swelling in the third digit of her left hand.  She intermittently notices dropping items and has occasional numbness in her fingers.  She does have a history of carpal tunnel syndrome and is planned for upcoming evaluation with nerve test this problem.  She does not notice as much trouble in her lower extremities though sometimes has some knee pain and  popping.  She has a long history of skin psoriasis with involvement mild on her elbows most extensive on her anterior shins with extensive residual scarring.  She also has chronic nail pitting irregularities and longitudinal ridges that she is not sure whether definitively related to psoriasis or not.  Currently symptoms do well she typically has very minimal disease activity during the summer with lots of outdoor exposure.  Her symptoms typically worsen during the winter months.   Review of Systems  Constitutional:  Positive for fatigue.  HENT:  Positive for mouth dryness.   Eyes:  Negative for dryness.  Respiratory:  Negative for shortness of breath.   Cardiovascular:  Negative for swelling in legs/feet.  Gastrointestinal:  Negative for constipation.  Endocrine: Positive for cold intolerance.  Genitourinary:  Negative for difficulty urinating.  Musculoskeletal:  Positive for joint pain, gait problem, joint pain and morning stiffness.  Skin:  Positive for rash.  Allergic/Immunologic: Negative for susceptible to infections.  Neurological:  Positive for numbness.  Hematological:  Negative for bruising/bleeding tendency.  Psychiatric/Behavioral:  Positive for sleep disturbance.    PMFS History:  Patient Active Problem List   Diagnosis Date Noted   Acute right-sided low back pain 03/29/2021   Trigger finger, right middle finger 03/29/2021   Generalized osteoarthritis 11/23/2020   Cervical spondylosis 10/28/2020   Chronic cervical pain 09/17/2020   Grief 11/04/2019   Insomnia 02/08/2016   GAD (generalized anxiety disorder) 12/29/2014   GERD (gastroesophageal  reflux disease) 10/07/2014   Chronic pain syndrome 10/07/2014   Bilateral carpal tunnel syndrome 09/22/2014   Obstructive sleep apnea 09/22/2014   Psoriasis 09/22/2014    Past Medical History:  Diagnosis Date   Herniated disc, cervical    Hypertension    Psoriasis     Family History  Problem Relation Age of Onset    Diabetes Mother    Hyperlipidemia Mother    Hypertension Mother    Alcohol abuse Father    Cancer Father    Heart disease Father    Diabetes Father    Hyperlipidemia Father    Hypertension Father    Heart disease Brother    Asthma Brother    Alcohol abuse Brother    Early death Brother    Depression Son    Diabetes Son    Healthy Son    Past Surgical History:  Procedure Laterality Date   BREAST CYST ASPIRATION Left    TUBAL LIGATION     Social History   Social History Narrative   Not on file   Immunization History  Administered Date(s) Administered   Influenza Inj Mdck Quad Pf 01/10/2019   Influenza, High Dose Seasonal PF 03/09/2017   Influenza,inj,Quad PF,6+ Mos 12/29/2014, 01/04/2016, 03/25/2018   Influenza-Unspecified 03/31/2014, 03/05/2020   PFIZER(Purple Top)SARS-COV-2 Vaccination 07/16/2019, 08/05/2019, 03/05/2020   PNEUMOCOCCAL CONJUGATE-20 09/16/2020   Tdap 03/31/2014, 01/10/2020   Zoster Recombinat (Shingrix) 09/16/2020     Objective: Vital Signs: BP 117/72 (BP Location: Left Arm, Patient Position: Sitting, Cuff Size: Normal)   Pulse 67   Resp 15   Ht 5\' 6"  (1.676 m)   Wt 154 lb (69.9 kg)   BMI 24.86 kg/m    Physical Exam HENT:     Right Ear: External ear normal.     Left Ear: External ear normal.  Cardiovascular:     Rate and Rhythm: Normal rate and regular rhythm.  Pulmonary:     Effort: Pulmonary effort is normal.     Breath sounds: Normal breath sounds.  Musculoskeletal:     Right lower leg: No edema.     Left lower leg: No edema.  Skin:    General: Skin is warm and dry.     Comments: Patchy skin changes on distal legs, few areas of hyperkeratosis, mild hyperpigmentation without induration or pitting edema  Neurological:     General: No focal deficit present.     Mental Status: She is alert.     Comments: Positive tinel's sign on right wrist  Psychiatric:        Mood and Affect: Mood normal.     Musculoskeletal Exam:  Shoulders full  ROM no tenderness or swelling Elbows full ROM no tenderness or swelling Right wrist slightly decreased extension ROM, no tenderness or swelling Fingers full ROM, mild bony nodule changes, PIP joint triggering of right 3rd and 4th fingers no palpable swelling No paraspinal tenderness to palpation over upper and lower back Hips decreased external rotation ROM, some posterior pain with FABER mild groin pain with internal rotation Knees full ROM no tenderness or swelling, patellofemoral crepitus present Ankles full ROM no tenderness or swelling  Limited ultrasound inspection of the hands showing cross sectional area of median nerve 11-12 mm^2 and 13-14 mm^2 on right. No synovitis or color doppler enhancement of PIPs.   Investigation: No additional findings.  Imaging: No results found.  Recent Labs: Lab Results  Component Value Date   WBC 4.5 09/16/2020   HGB 12.4 09/16/2020  PLT 230 09/16/2020   NA 140 09/16/2020   K 3.9 09/16/2020   CL 102 09/16/2020   CO2 30 09/16/2020   GLUCOSE 88 09/16/2020   BUN 14 09/16/2020   CREATININE 0.75 09/16/2020   BILITOT 0.5 09/16/2020   ALKPHOS 46 08/06/2014   AST 15 09/16/2020   ALT 15 09/16/2020   PROT 7.2 09/16/2020   CALCIUM 9.8 09/16/2020   GFRAA 96 09/16/2020    Speciality Comments: No specialty comments available.  Procedures:  No procedures performed Allergies: Lisinopril   Assessment / Plan:     Visit Diagnoses: Bilateral carpal tunnel syndrome  Carpal tunnel syndrome symptoms mild-moderate no strength loss or atrophy just continuing conservative treatment stretching exercises provided as well.  Psoriasis Generalized osteoarthritis  Minimal skin rashes currently. No joint inflammation changes on exam. I think problems are more related to generalized osteoarthritis currently. Currently tolerating meloxicam well no DMARD treatment recommended at this time.  Acute right-sided low back pain without sciatica - Plan:  cyclobenzaprine (FLEXERIL) 5 MG tablet  Back pain most consistent with muscle pain around low back or gluteals recommended stretching exercises and trial of low dose flexeril at night for lying in bed pain.  Trigger finger, right middle finger  Mild, reviewed problem discussed splinting as option and extension stretching for this and avoiding irritation, can f/u as needed if worsening would treat with local injection.  Orders: No orders of the defined types were placed in this encounter.  Meds ordered this encounter  Medications   cyclobenzaprine (FLEXERIL) 5 MG tablet    Sig: Take 1 tablet (5 mg total) by mouth at bedtime as needed for muscle spasms.    Dispense:  20 tablet    Refill:  0      Follow-Up Instructions: Return if symptoms worsen or fail to improve.   Fuller Plan, MD  Note - This record has been created using [employer].  Chart creation errors have been sought, but may not always  have been located. Such creation errors do not reflect on  the standard of medical care.

## 2021-03-29 ENCOUNTER — Ambulatory Visit: Payer: BC Managed Care – PPO | Admitting: Internal Medicine

## 2021-03-29 ENCOUNTER — Other Ambulatory Visit: Payer: Self-pay

## 2021-03-29 ENCOUNTER — Encounter: Payer: Self-pay | Admitting: Internal Medicine

## 2021-03-29 VITALS — BP 117/72 | HR 67 | Resp 15 | Ht 66.0 in | Wt 154.0 lb

## 2021-03-29 DIAGNOSIS — M545 Low back pain, unspecified: Secondary | ICD-10-CM

## 2021-03-29 DIAGNOSIS — M159 Polyosteoarthritis, unspecified: Secondary | ICD-10-CM

## 2021-03-29 DIAGNOSIS — L409 Psoriasis, unspecified: Secondary | ICD-10-CM | POA: Diagnosis not present

## 2021-03-29 DIAGNOSIS — G5603 Carpal tunnel syndrome, bilateral upper limbs: Secondary | ICD-10-CM

## 2021-03-29 DIAGNOSIS — M51369 Other intervertebral disc degeneration, lumbar region without mention of lumbar back pain or lower extremity pain: Secondary | ICD-10-CM | POA: Insufficient documentation

## 2021-03-29 DIAGNOSIS — M65331 Trigger finger, right middle finger: Secondary | ICD-10-CM

## 2021-03-29 MED ORDER — CYCLOBENZAPRINE HCL 5 MG PO TABS: 5.0000 mg | ORAL_TABLET | Freq: Every evening | ORAL | 0 refills | Status: DC | PRN

## 2021-04-14 ENCOUNTER — Other Ambulatory Visit: Payer: Self-pay | Admitting: Internal Medicine

## 2021-04-14 DIAGNOSIS — M545 Low back pain, unspecified: Secondary | ICD-10-CM

## 2021-04-14 NOTE — Telephone Encounter (Signed)
Next Visit: not scheduled.   Last Visit: 03/29/2021  Last Fill:03/29/2021  Dx: Acute right-sided low back pain without sciatica   Current Dose per office note on 03/29/2021: Take 1 tablet (5 mg total) by mouth at bedtime as needed for muscle spasms.  Okay to refill Flexeril?

## 2021-07-09 IMAGING — DX DG HAND 2V*R*
2 series · 2 of 2 positions shown · non-contrast
Comparison: None.

CLINICAL DATA: Hand arthralgia.

EXAM:
RIGHT HAND - 2 VIEW

[hand pa]
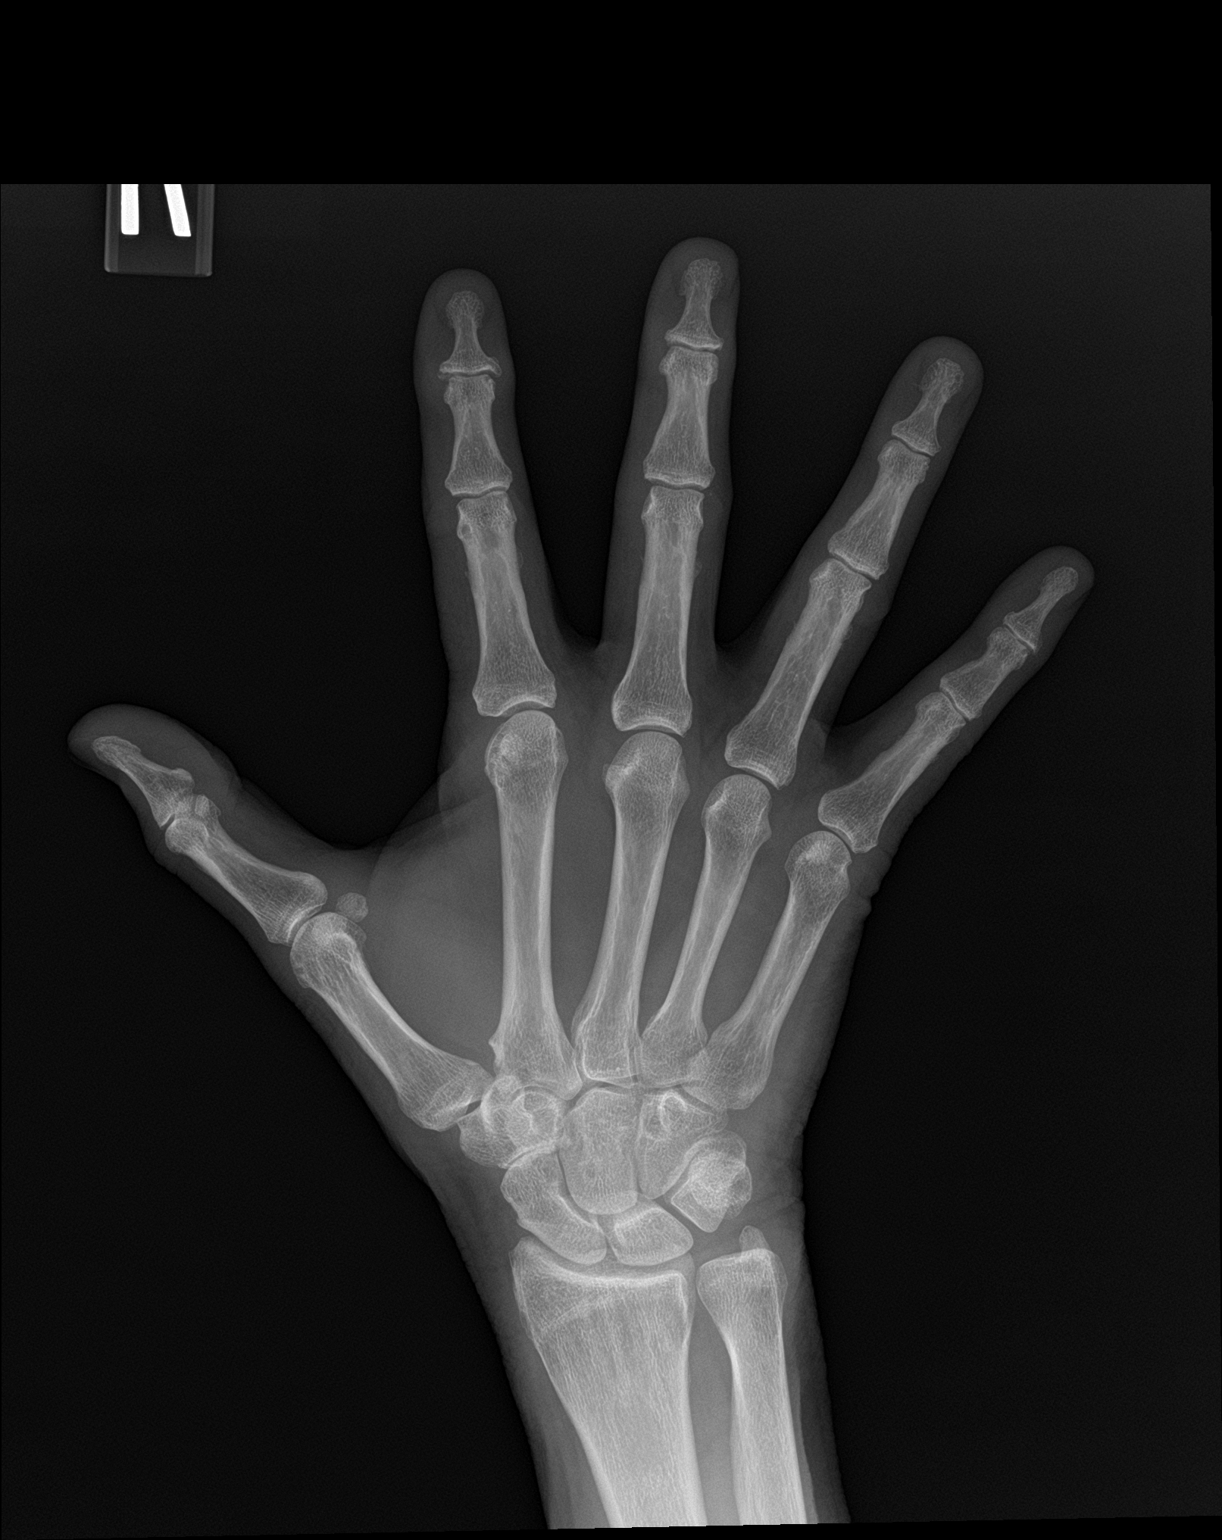

[hand lat]
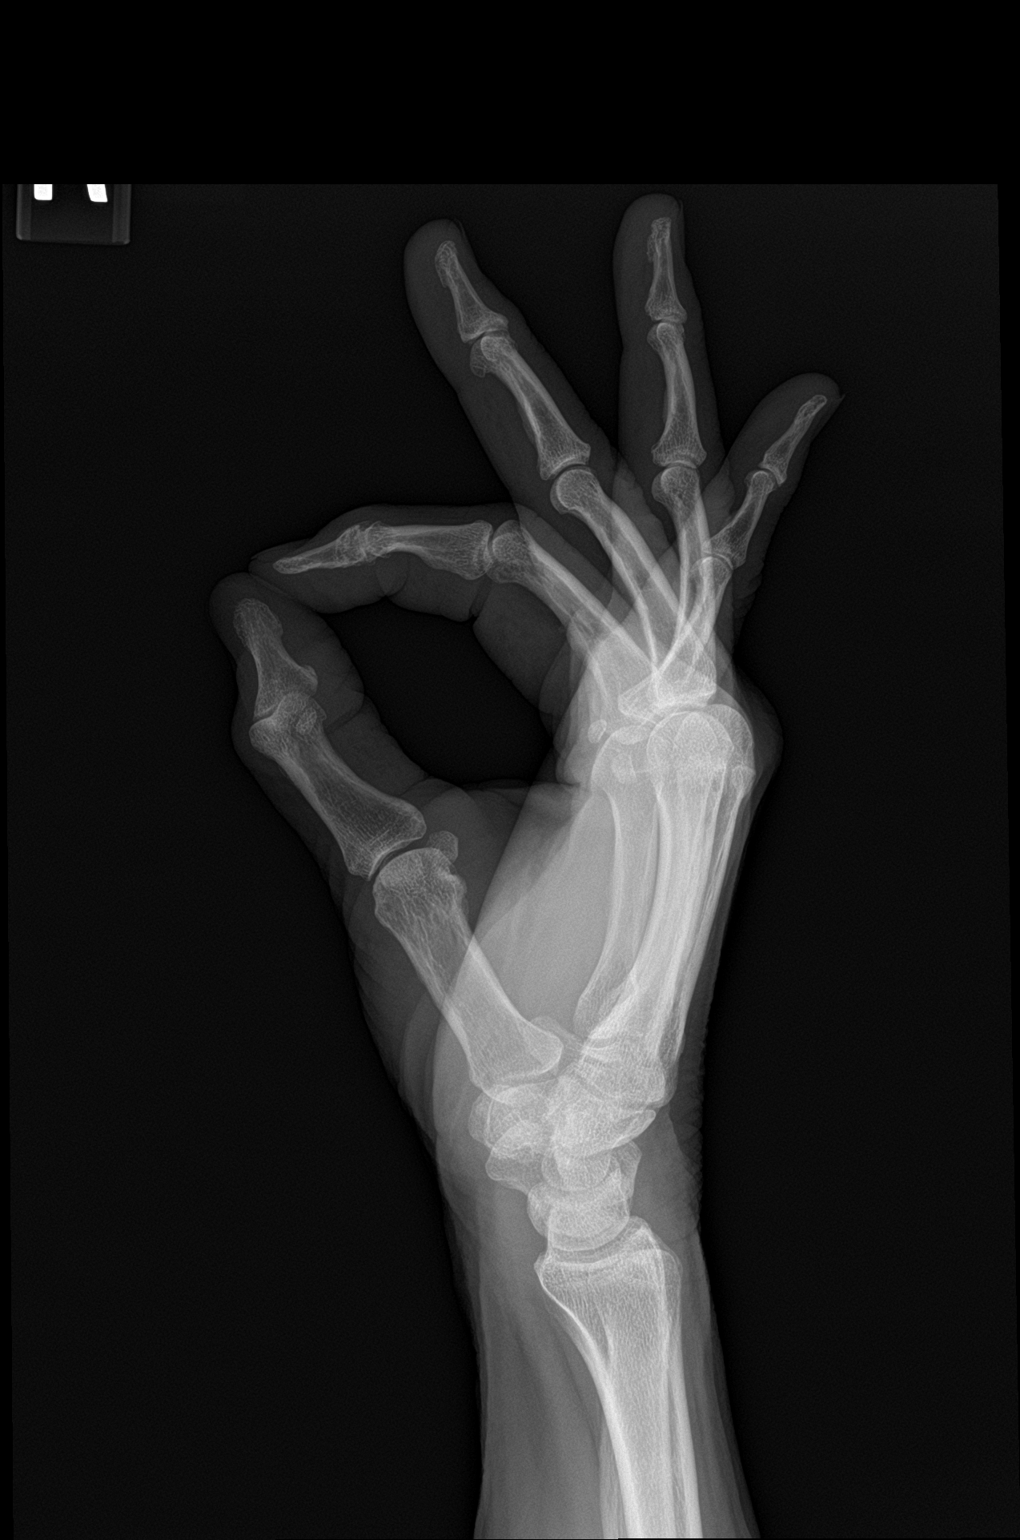

[2 of 2 positions shown; findings below may reference images not displayed]

FINDINGS: Bone mineralization is subjectively normal. Normal alignment. Small
erosions involving the middle phalanges of the second and fifth
digits of sclerotic margins. Minimal associated joint space
narrowing. Minor osteophytes involving the third digit distal
phalangeal joint. No fracture. No bone destruction. No focal soft
tissue abnormality.
IMPRESSION: 1. Small erosions involving the middle phalanges of the second and
fifth digits with sclerotic margins, may represent sequela of
inflammatory arthropathy, however distribution and appearance is
nonspecific.
2. Mild osteoarthritis of the third digit.

## 2021-07-09 IMAGING — DX DG HAND 2V*L*
2 series · 2 of 2 positions shown · non-contrast
Comparison: None.

CLINICAL DATA: Bilateral hand arthralgia.

EXAM:
LEFT HAND - 2 VIEW

[hand pa]
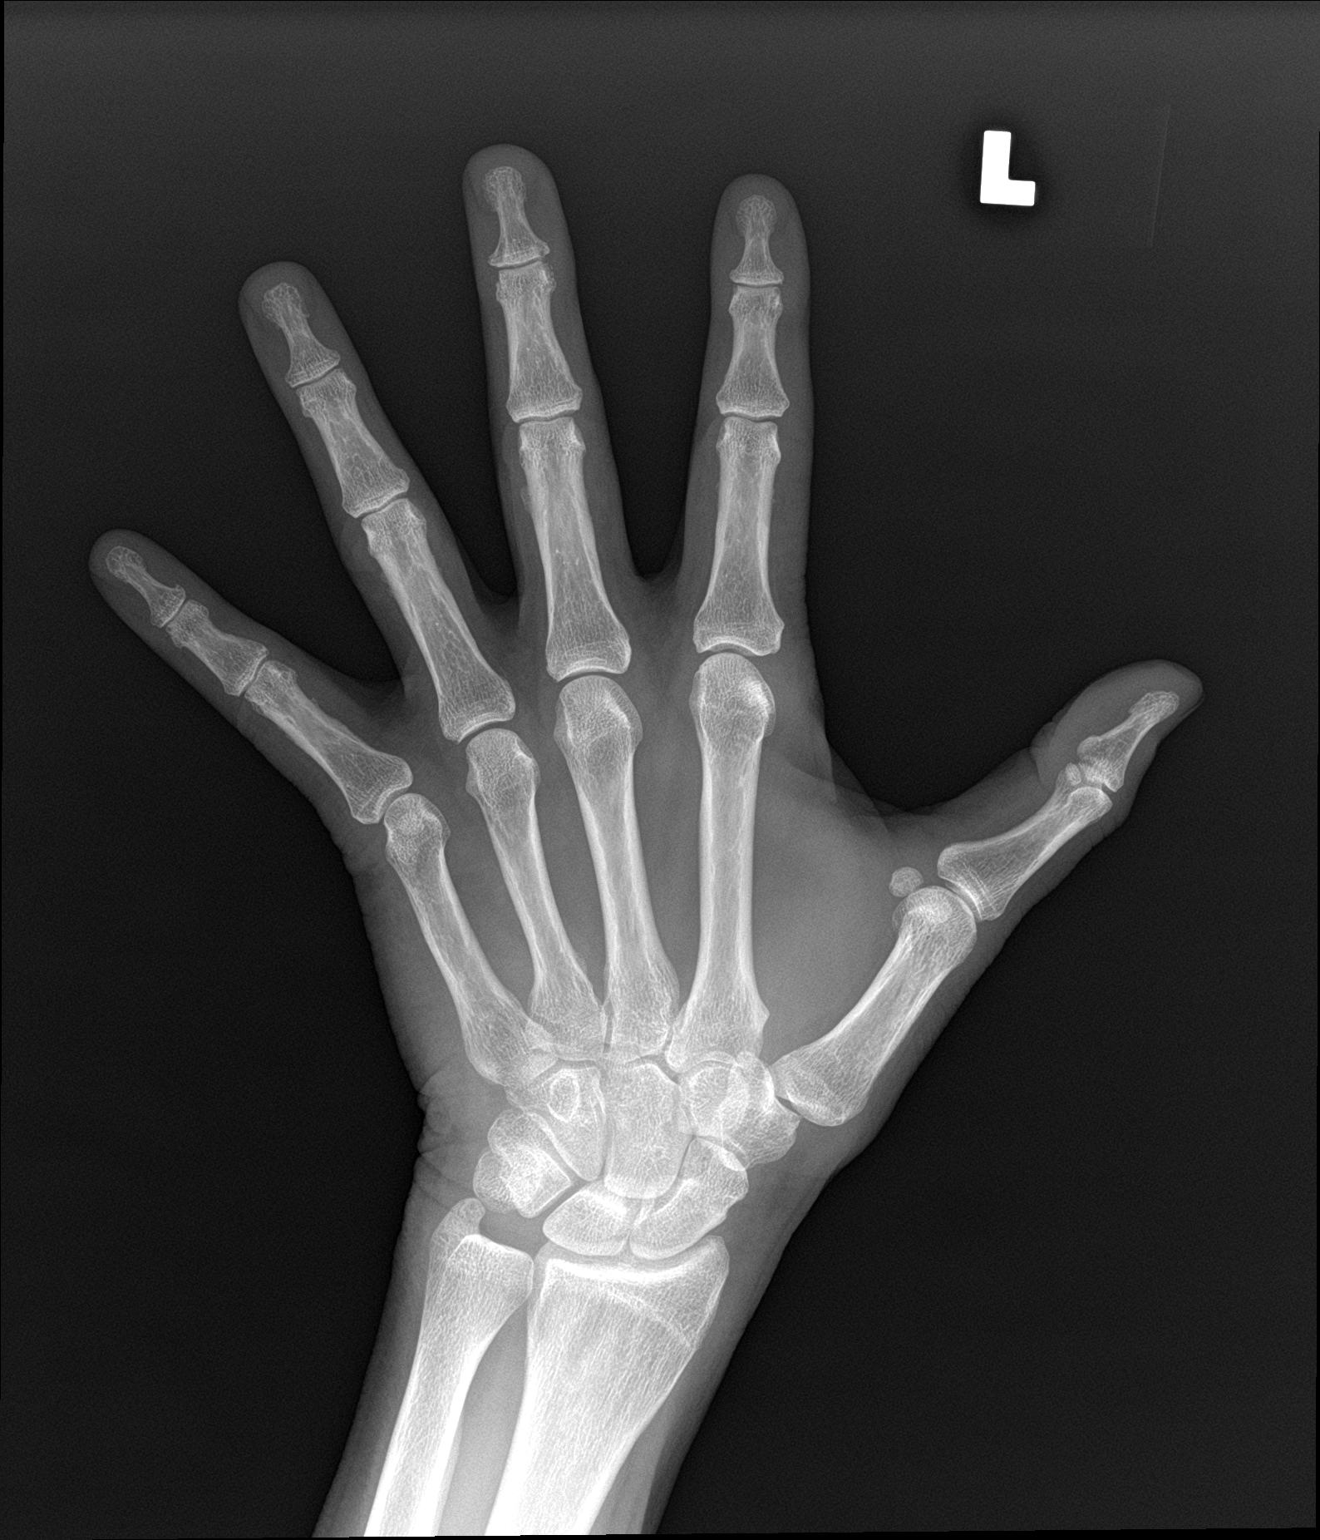

[hand lat]
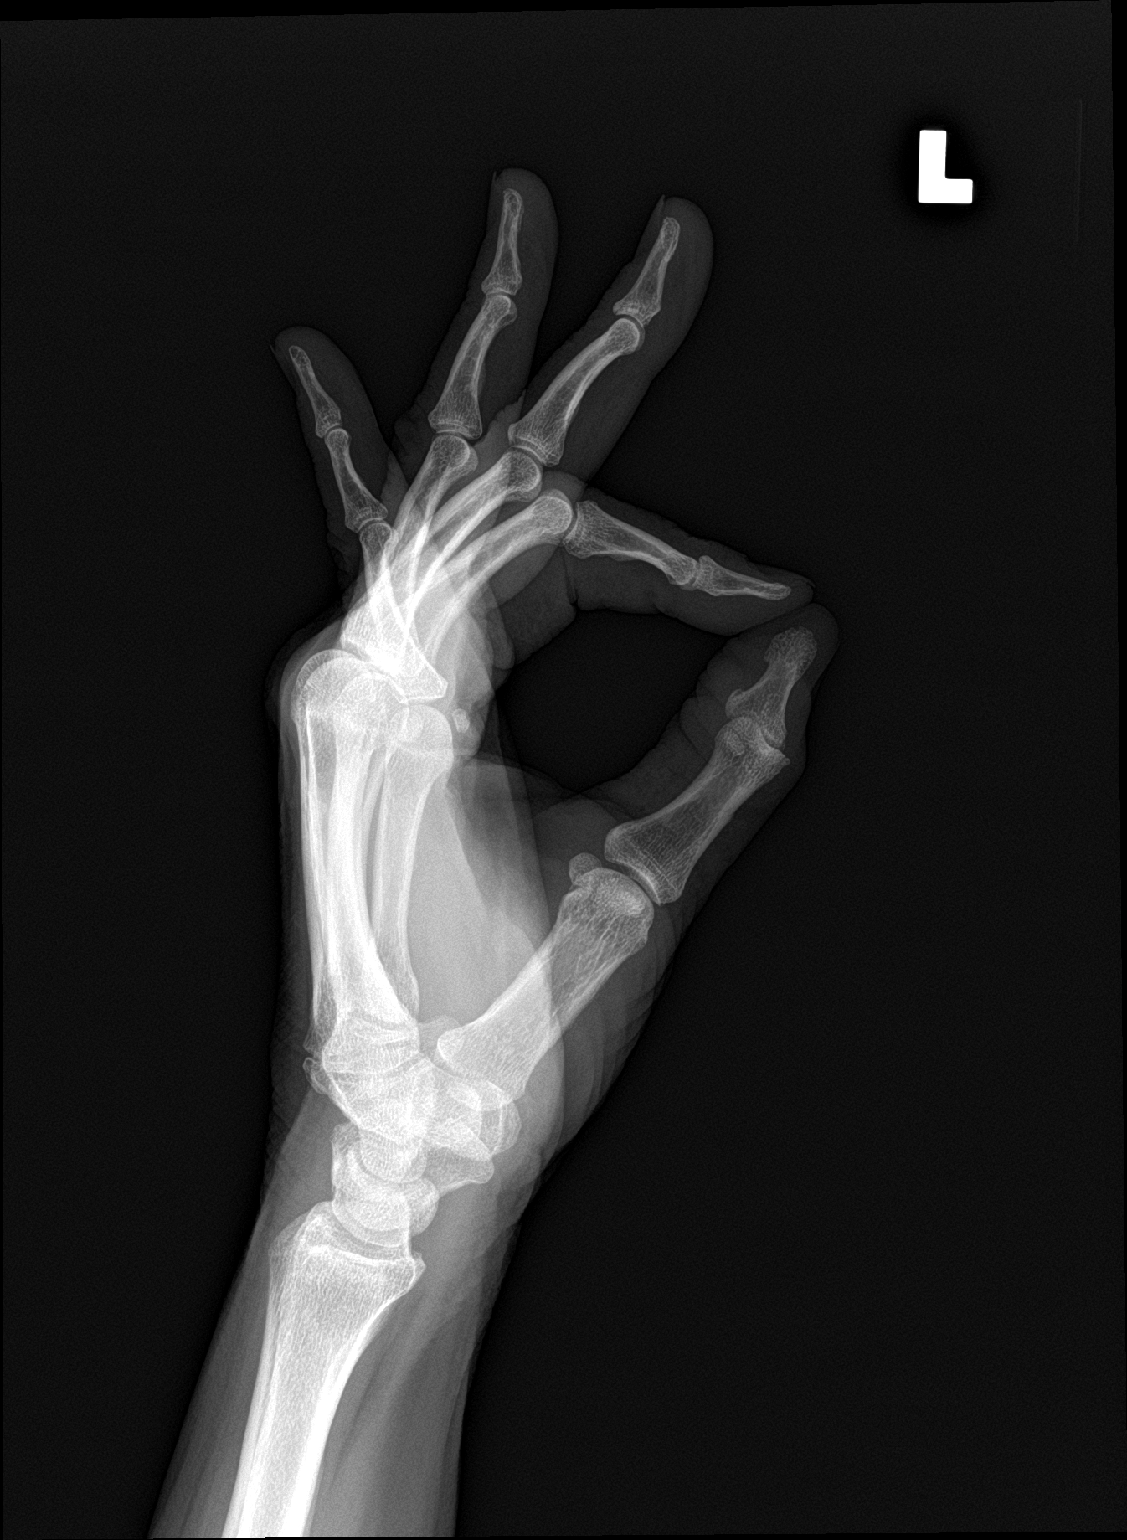

[2 of 2 positions shown; findings below may reference images not displayed]

FINDINGS: Bone mineralization is subjectively normal. There is no evidence of
fracture or dislocation. Normal joint spaces and alignment. There is
[REDACTED]. There is no evidence of arthropathy or other focal
bone abnormality. Soft tissues are unremarkable.
IMPRESSION: [REDACTED]. Otherwise unremarkable radiographs of the left hand.

## 2021-07-14 ENCOUNTER — Ambulatory Visit (INDEPENDENT_AMBULATORY_CARE_PROVIDER_SITE_OTHER): Payer: Medicare (Managed Care) | Admitting: Family Medicine

## 2021-07-14 ENCOUNTER — Encounter: Payer: Self-pay | Admitting: Family Medicine

## 2021-07-14 ENCOUNTER — Other Ambulatory Visit: Payer: Self-pay

## 2021-07-14 VITALS — BP 117/68 | HR 63 | Ht 66.0 in | Wt 152.0 lb

## 2021-07-14 DIAGNOSIS — F5101 Primary insomnia: Secondary | ICD-10-CM

## 2021-07-14 DIAGNOSIS — K219 Gastro-esophageal reflux disease without esophagitis: Secondary | ICD-10-CM

## 2021-07-14 DIAGNOSIS — F411 Generalized anxiety disorder: Secondary | ICD-10-CM

## 2021-07-14 DIAGNOSIS — J301 Allergic rhinitis due to pollen: Secondary | ICD-10-CM

## 2021-07-14 DIAGNOSIS — E785 Hyperlipidemia, unspecified: Secondary | ICD-10-CM | POA: Diagnosis not present

## 2021-07-14 DIAGNOSIS — Z13220 Encounter for screening for lipoid disorders: Secondary | ICD-10-CM | POA: Diagnosis not present

## 2021-07-14 DIAGNOSIS — Z1211 Encounter for screening for malignant neoplasm of colon: Secondary | ICD-10-CM | POA: Diagnosis not present

## 2021-07-14 DIAGNOSIS — Z79899 Other long term (current) drug therapy: Secondary | ICD-10-CM | POA: Diagnosis not present

## 2021-07-14 MED ORDER — CLONAZEPAM 1 MG PO TABS: 1.0000 mg | ORAL_TABLET | Freq: Every day | ORAL | 1 refills | Status: DC | PRN

## 2021-07-14 MED ORDER — OMEPRAZOLE 20 MG PO CPDR: 20.0000 mg | DELAYED_RELEASE_CAPSULE | Freq: Every day | ORAL | 3 refills | Status: DC

## 2021-07-14 MED ORDER — TRAZODONE HCL 50 MG PO TABS: 150.0000 mg | ORAL_TABLET | Freq: Every day | ORAL | 3 refills | Status: DC

## 2021-07-14 MED ORDER — FLUTICASONE PROPIONATE 50 MCG/ACT NA SUSP: 1.0000 | Freq: Every day | NASAL | 3 refills | Status: DC

## 2021-07-14 NOTE — Progress Notes (Signed)
? ?Established Patient Office Visit ? ?Subjective:  ?Patient ID: Desiree BushmanCindy Hughes, female    DOB: Sep 09, 1955  Age: 66 y.o. MRN: [id number] ? ?CC:  ?Chief Complaint  ?Patient presents with  ? Medication Refill  ? ? ?HPI ?Desiree BushmanCindy Hughes presents for f/u GAD - would like  refill for her clonazepam PRN.  Her sister has been one of the biggest stressors in her life currently.  She has bipolar disorder and so the ups and downs can be quite to mulch was.  [place] also [place] and that kind of affected their income and Social Security benefits of that has been stressful as well. ? ?Insomnia-currently takes trazodone for sleep.  Most nights it does seem to help some nights she only gets a couple hours of sleep it really just varies.  She tends to track her sleep on her Fitbit. ? ?She did just start Medicare earlier this month. ? ?Past Medical History:  ?Diagnosis Date  ? Herniated disc, cervical   ? Hypertension   ? Psoriasis   ? ? ?Past Surgical History:  ?Procedure Laterality Date  ? BREAST CYST ASPIRATION Left   ? TUBAL LIGATION    ? ? ?Family History  ?Problem Relation Age of Onset  ? Diabetes Mother   ? Hyperlipidemia Mother   ? Hypertension Mother   ? Alcohol abuse Father   ? Cancer Father   ? Heart disease Father   ? Diabetes Father   ? Hyperlipidemia Father   ? Hypertension Father   ? Heart disease Brother   ? Asthma Brother   ? Alcohol abuse Brother   ? Early death Brother   ? Depression Son   ? Diabetes Son   ? Healthy Son   ? ? ?Social History  ? ?Socioeconomic History  ? Marital status: Married  ?  Spouse name: Not on file  ? Number of children: Not on file  ? Years of education: Not on file  ? Highest education level: Not on file  ?Occupational History  ? Not on file  ?Tobacco Use  ? Smoking status: Never  ? Smokeless tobacco: Never  ?Vaping Use  ? Vaping Use: Never used  ?Substance and Sexual Activity  ? Alcohol use: Yes  ?  Comment: Occasionally  ? Drug use: No  ? Sexual activity:  Never  ?Other Topics Concern  ? Not on file  ?Social History Narrative  ? Not on file  ? ?Social Determinants of Health  ? ?Financial Resource Strain: Not on file  ?Food Insecurity: Not on file  ?Transportation Needs: Not on file  ?Physical Activity: Not on file  ?Stress: Not on file  ?Social Connections: Not on file  ?Intimate Partner Violence: Not on file  ? ? ?Outpatient Medications Prior to Visit  ?Medication Sig Dispense Refill  ? AMBULATORY NON FORMULARY MEDICATION Medication Name: Needs new headgear, mask, tubing for her CPAP machine.  Please order through [facility] 1 vial PRN  ? B Complex-C (SUPER B COMPLEX PO) Take by mouth.    ? BIOTIN PO Take by mouth.    ? CALCIUM PO Take by mouth.    ? Cholecalciferol (VITAMIN D-3 PO) Take by mouth.    ? cyclobenzaprine (FLEXERIL) 5 MG tablet TAKE 1 TABLET BY MOUTH AT BEDTIME AS NEEDED FOR MUSCLE SPASMS. 30 tablet 1  ? Flax OIL Take by mouth.    ? HYDROcodone-acetaminophen (NORCO/VICODIN) 5-325 MG tablet Take 1 tablet by mouth as needed for moderate pain.    ?  MELATONIN PO Take by mouth.    ? meloxicam (MOBIC) 15 MG tablet TAKE 1 TABLET DAILY EVERY MORNING WITH FOOD 90 tablet 4  ? MILK THISTLE PO Take by mouth.    ? Multiple Vitamins-Minerals (CENTRUM SILVER ULTRA WOMENS PO) Take by mouth.    ? nystatin-triamcinolone ointment (MYCOLOG) Apply 1 application topically 2 (two) times daily. 30 g prn  ? Omega-3 Fatty Acids (FISH OIL PO) Take by mouth.    ? vitamin C (ASCORBIC ACID) 500 MG tablet Take 500 mg by mouth daily.    ? clonazePAM (KLONOPIN) 1 MG tablet Take 1 tablet (1 mg total) by mouth daily as needed for anxiety. 30 tablet 1  ? fluticasone (FLONASE) 50 MCG/ACT nasal spray Place 1 spray into both nostrils daily. 48 g 3  ? omeprazole (PRILOSEC) 20 MG capsule Take 1 capsule (20 mg total) by mouth daily. 90 capsule 3  ? traZODone (DESYREL) 50 MG tablet Take 3 tablets (150 mg total) by mouth at bedtime. 270 tablet 3  ? AMBULATORY NON FORMULARY MEDICATION Contact [facility] for  download from her CPAP for the last 30 days 1 vial 0  ? ?No facility-administered medications prior to visit.  ? ? ?Allergies  ?Allergen Reactions  ? Lisinopril Cough  ? ? ?ROS ?Review of Systems ? ?  ?Objective:  ?  ?Physical Exam ?Constitutional:   ?   Appearance: Normal appearance. She is well-developed.  ?HENT:  ?   Head: Normocephalic and atraumatic.  ?Cardiovascular:  ?   Rate and Rhythm: Normal rate and regular rhythm.  ?   Heart sounds: Normal heart sounds.  ?Pulmonary:  ?   Effort: Pulmonary effort is normal.  ?   Breath sounds: Normal breath sounds.  ?Skin: ?   General: Skin is warm and dry.  ?Neurological:  ?   Mental Status: She is alert and oriented to person, place, and time.  ?Psychiatric:     ?   Behavior: Behavior normal.  ? ? ?BP 117/68   Pulse 63   Ht 5\' 6"  (1.676 m)   Wt 152 lb (68.9 kg)   SpO2 99%   BMI 24.53 kg/m?  ?Wt Readings from Last 3 Encounters:  ?07/14/21 152 lb (68.9 kg)  ?03/29/21 154 lb (69.9 kg)  ?11/23/20 147 lb 12.8 oz (67 kg)  ? ? ? ?Health Maintenance Due  ?Topic Date Due  ? DEXA SCAN  Never done  ? COLON CANCER SCREENING ANNUAL FOBT  10/28/2020  ? ? ?There are no preventive care reminders to display for this patient. ? ?Lab Results  ?Component Value Date  ? TSH 1.38 09/16/2020  ? ?Lab Results  ?Component Value Date  ? WBC 4.5 09/16/2020  ? HGB 12.4 09/16/2020  ? HCT 37.9 09/16/2020  ? MCV 98.2 09/16/2020  ? PLT 230 09/16/2020  ? ?Lab Results  ?Component Value Date  ? NA 140 09/16/2020  ? K 3.9 09/16/2020  ? CO2 30 09/16/2020  ? GLUCOSE 88 09/16/2020  ? BUN 14 09/16/2020  ? CREATININE 0.75 09/16/2020  ? BILITOT 0.5 09/16/2020  ? ALKPHOS 46 08/06/2014  ? AST 15 09/16/2020  ? ALT 15 09/16/2020  ? PROT 7.2 09/16/2020  ? CALCIUM 9.8 09/16/2020  ? ?Lab Results  ?Component Value Date  ? CHOL 214 (H) 09/16/2020  ? ?Lab Results  ?Component Value Date  ? HDL 88 09/16/2020  ? ?Lab Results  ?Component Value Date  ? LDLCALC 110 (H) 09/16/2020  ? ?Lab Results  ?Component Value Date  ?  TRIG 70 09/16/2020  ? ?Lab Results  ?Component Value Date  ? CHOLHDL 2.4 09/16/2020  ? ?Lab Results  ?Component Value Date  ? HGBA1C 5.2 09/24/2018  ? ? ?  ?Assessment & Plan:  ? ?Problem List Items Addressed This Visit   ? ?  ? Other  ? Insomnia  ?  Work on stress reduction and improving sleep quality.  Trazodone refilled. ?  ?  ? Relevant Medications  ? traZODone (DESYREL) 50 MG tablet  ? GAD (generalized anxiety disorder)  ?  Doing well overall.  We will go ahead and refill clonazepam just reminded her to continue to use sparingly. ?  ?  ? Relevant Medications  ? clonazePAM (KLONOPIN) 1 MG tablet  ? traZODone (DESYREL) 50 MG tablet  ? ?Other Visit Diagnoses   ? ? Screening, lipid    -  Primary  ? Relevant Orders  ? Lipid panel  ? COMPLETE METABOLIC PANEL WITH GFR  ? TSH  ? Allergic rhinitis due to pollen, unspecified seasonality      ? Relevant Medications  ? fluticasone (FLONASE) 50 MCG/ACT nasal spray  ? Gastroesophageal reflux disease      ? Relevant Medications  ? omeprazole (PRILOSEC) 20 MG capsule  ? Screening for malignant neoplasm of colon      ? Relevant Orders  ? POC Hemoccult Bld/Stl (3-Cd Home Screen)  ? ?  ? ?Due for colon cancer screening.  Agreed to do stool cards. ? ?We will get updated labs today. ? ? ?Meds ordered this encounter  ?Medications  ? clonazePAM (KLONOPIN) 1 MG tablet  ?  Sig: Take 1 tablet (1 mg total) by mouth daily as needed for anxiety.  ?  Dispense:  30 tablet  ?  Refill:  1  ? fluticasone (FLONASE) 50 MCG/ACT nasal spray  ?  Sig: Place 1 spray into both nostrils daily.  ?  Dispense:  48 g  ?  Refill:  3  ? omeprazole (PRILOSEC) 20 MG capsule  ?  Sig: Take 1 capsule (20 mg total) by mouth daily.  ?  Dispense:  90 capsule  ?  Refill:  3  ? traZODone (DESYREL) 50 MG tablet  ?  Sig: Take 3 tablets (150 mg total) by mouth at bedtime.  ?  Dispense:  270 tablet  ?  Refill:  3  ? ? ?Follow-up: Return if symptoms worsen or fail to improve.  ? ? ?Nani Gasser, MD ?

## 2021-07-14 NOTE — Assessment & Plan Note (Signed)
Doing well overall.  We will go ahead and refill clonazepam just reminded her to continue to use sparingly. ?

## 2021-07-14 NOTE — Assessment & Plan Note (Signed)
Work on stress reduction and improving sleep quality.  Trazodone refilled. ?

## 2021-07-15 LAB — COMPLETE METABOLIC PANEL WITH GFR
AG Ratio: 1.6 (calc) (ref 1.0–2.5)
ALT: 13 U/L (ref 6–29)
AST: 13 U/L (ref 10–35)
Albumin: 4.4 g/dL (ref 3.6–5.1)
Alkaline phosphatase (APISO): 48 U/L (ref 37–153)
BUN: 18 mg/dL (ref 7–25)
CO2: 29 mmol/L (ref 20–32)
Calcium: 9.8 mg/dL (ref 8.6–10.4)
Chloride: 101 mmol/L (ref 98–110)
Creat: 0.7 mg/dL (ref 0.50–1.05)
Globulin: 2.8 g/dL (calc) (ref 1.9–3.7)
Glucose, Bld: 96 mg/dL (ref 65–99)
Potassium: 4.5 mmol/L (ref 3.5–5.3)
Sodium: 139 mmol/L (ref 135–146)
Total Bilirubin: 0.5 mg/dL (ref 0.2–1.2)
Total Protein: 7.2 g/dL (ref 6.1–8.1)
eGFR: 95 mL/min/{1.73_m2} (ref >=60)

## 2021-07-15 LAB — LIPID PANEL
Cholesterol: 220 mg/dL — ABNORMAL HIGH (ref <200)
HDL: 88 mg/dL (ref >=50)
LDL Cholesterol (Calc): 115 mg/dL (calc) — ABNORMAL HIGH
Non-HDL Cholesterol (Calc): 132 mg/dL (calc) — ABNORMAL HIGH (ref <130)
Total CHOL/HDL Ratio: 2.5 (calc) (ref <5.0)
Triglycerides: 77 mg/dL (ref <150)

## 2021-07-15 LAB — TSH: TSH: 1.47 mIU/L (ref 0.40–4.50)

## 2021-07-15 NOTE — Progress Notes (Signed)
Hi Kinlee, LDL cholesterol is mildly elevated its Been trending up over the last few years.  Just encourage you to continue to work on healthy diet and regular exercise.  Your metabolic panel and thyroid look great. ? ?The 10-year ASCVD risk score (Arnett DK, et al., 2019) is: 4.6% ?  Values used to calculate the score: ?    Age: 66 years ?    Sex: Female ?    Is Non-Hispanic African American: No ?    Diabetic: No ?    Tobacco smoker: No ?    Systolic Blood Pressure: 117 mmHg ?    Is BP treated: No ?    HDL Cholesterol: 88 mg/dL ?    Total Cholesterol: 220 mg/dL ?

## 2021-07-20 ENCOUNTER — Other Ambulatory Visit: Payer: Self-pay | Admitting: *Deleted

## 2021-07-20 DIAGNOSIS — Z1211 Encounter for screening for malignant neoplasm of colon: Secondary | ICD-10-CM

## 2021-07-20 LAB — POC HEMOCCULT BLD/STL (HOME/3-CARD/SCREEN)
Card #2 Fecal Occult Blod, POC: NEGATIVE
Card #3 Fecal Occult Blood, POC: NEGATIVE
Fecal Occult Blood, POC: NEGATIVE

## 2021-07-20 NOTE — Progress Notes (Signed)
HI Desiree Hughes, stools are normal. Repeat in one year.

## 2021-07-21 DIAGNOSIS — M47812 Spondylosis without myelopathy or radiculopathy, cervical region: Secondary | ICD-10-CM | POA: Diagnosis not present

## 2021-07-21 DIAGNOSIS — M4802 Spinal stenosis, cervical region: Secondary | ICD-10-CM | POA: Diagnosis not present

## 2021-09-06 ENCOUNTER — Other Ambulatory Visit: Payer: Self-pay

## 2021-09-06 DIAGNOSIS — M47812 Spondylosis without myelopathy or radiculopathy, cervical region: Secondary | ICD-10-CM

## 2021-09-06 DIAGNOSIS — L409 Psoriasis, unspecified: Secondary | ICD-10-CM

## 2021-09-06 MED ORDER — MELOXICAM 15 MG PO TABS: ORAL_TABLET | ORAL | 1 refills | Status: DC

## 2021-09-06 MED ORDER — NYSTATIN-TRIAMCINOLONE 100000-0.1 UNIT/GM-% EX OINT: 1.0000 "application " | TOPICAL_OINTMENT | Freq: Two times a day (BID) | CUTANEOUS | 99 refills | Status: DC

## 2021-12-14 ENCOUNTER — Encounter: Payer: Self-pay | Admitting: General Practice

## 2021-12-19 DIAGNOSIS — Z0000 Encounter for general adult medical examination without abnormal findings: Secondary | ICD-10-CM | POA: Diagnosis not present

## 2021-12-21 ENCOUNTER — Encounter: Payer: Self-pay | Admitting: Family Medicine

## 2021-12-21 ENCOUNTER — Ambulatory Visit (INDEPENDENT_AMBULATORY_CARE_PROVIDER_SITE_OTHER): Payer: Medicare (Managed Care) | Admitting: Family Medicine

## 2021-12-21 VITALS — BP 129/77 | HR 67 | Temp 98.5°F | Resp 20 | Ht 66.0 in | Wt 153.1 lb

## 2021-12-21 DIAGNOSIS — Z780 Asymptomatic menopausal state: Secondary | ICD-10-CM

## 2021-12-21 DIAGNOSIS — G4733 Obstructive sleep apnea (adult) (pediatric): Secondary | ICD-10-CM | POA: Diagnosis not present

## 2021-12-21 DIAGNOSIS — M2559 Pain in other specified joint: Secondary | ICD-10-CM | POA: Diagnosis not present

## 2021-12-21 DIAGNOSIS — R509 Fever, unspecified: Secondary | ICD-10-CM

## 2021-12-21 DIAGNOSIS — M2550 Pain in unspecified joint: Secondary | ICD-10-CM

## 2021-12-21 DIAGNOSIS — Z1231 Encounter for screening mammogram for malignant neoplasm of breast: Secondary | ICD-10-CM

## 2021-12-21 NOTE — Assessment & Plan Note (Signed)
  Also needs some new CPAP supplies.  She is going to reach out to the company that she has been using first and see if they can send [place] an order.

## 2021-12-21 NOTE — Progress Notes (Signed)
Established Patient Office Visit  Subjective   Patient ID: Desiree Hughes, female    DOB: [birth year]  Age: 66 y.o. MRN: [id number]  Chief Complaint  Patient presents with   Temporomandibular Joint Pain   Back Pain    HPI  Joint pain for a couple of months.  She says it started more intermittently but over the last 2 weeks its been more persistent.  In particular she has had increased pain in her knees, left elbow, hips, shoulders.  She feels like her left hip is probably the worst area.  She has not noticed any specific joint swelling.  She has neck pain at baseline and feels like that is probably about the same.  She also has chronic low back pain and feels like that is about the same as well.  She says normally when her back flares that she gets sciatica but this time it is more of a pain across the bilateral low back area.  Yesterday she noticed she looked flushed and checked her temperature and it was 99.5.  She still been trying to exercise with the discomfort but it just seems much more persistent than she would expect.  A lot of her joint pain actually resolved when she lost about 70 pounds.  She has put a little bit of weight back but not enough that she feels that this should be causing this.  She does have a history of psoriasis.  No other family history of other autoimmune disorders.  Now having low back pain x 1 week.        ROS    Objective:     BP 129/77 (BP Location: Left Arm, Cuff Size: Normal)   Pulse 67   Temp 98.5 F (36.9 C)   Resp 20   Ht 5\' 6"  (1.676 m)   Wt 153 lb 1.3 oz (69.4 kg)   SpO2 99%   BMI 24.71 kg/m    Physical Exam Vitals and nursing note reviewed.  Constitutional:      Appearance: She is well-developed.  HENT:     Head: Normocephalic and atraumatic.  Cardiovascular:     Rate and Rhythm: Normal rate and regular rhythm.     Heart sounds: Normal heart sounds.  Pulmonary:     Effort: Pulmonary effort is normal.     Breath sounds: Normal  breath sounds.  Musculoskeletal:     Comments: Neck with normal range of motion some discomfort with rotation but no limitations.  Shoulders elbows and wrist with normal range of motion.  No swelling over the wrists or finger joints today.  Bilateral knees with no crepitus and good range of motion.  No increased laxity.  Knees, hips and ankle strength is 5-5 bilaterally.  Patellar reflex 1+ bilaterally.  Skin:    General: Skin is warm and dry.  Neurological:     Mental Status: She is alert and oriented to person, place, and time.  Psychiatric:        Behavior: Behavior normal.      No results found for any visits on 12/21/21.    The 10-year ASCVD risk score (Arnett DK, et al., 2019) is: 6.3%    Assessment & Plan:   Problem List Items Addressed This Visit       Respiratory   Obstructive sleep apnea     Also needs some new CPAP supplies.  She is going to reach out to the company that she has been using first and see if they  can send [place] an order.      Other Visit Diagnoses     Arthralgia, unspecified joint    -  Primary   Relevant Orders   CBC with Differential/Platelet   Urinalysis, Routine w reflex microscopic   COMPLETE METABOLIC PANEL WITH GFR   TSH   Sedimentation rate   C-reactive protein   Uric acid   Fever, unspecified fever cause       Screening mammogram for breast cancer       Relevant Orders   MM 3D SCREEN BREAST BILATERAL   Post-menopausal       Relevant Orders   DG Bone Density      Sudden ramp-up of polyarthralgia mostly in the larger joints with no distinct swelling and possible low-grade fever.  No abnormal weight loss.  We discussed doing an further work-up we will check for other autoimmune processes.  She does have underlying psoriasis so that certainly could be contributing.  We will check for thyroid disorder or electrolyte abnormalities.  We will also check for gout.  We will call with results once available.  She is already been using some  over-the-counter medications for pain relief but does not want to continue to take those if she does not need them.  Postmenopausal-would like to get updated bone density she has never had 1 before.  We will also order mammogram to be done at the same time.   No follow-ups on file.    Nani Gasser, MD

## 2021-12-22 LAB — COMPLETE METABOLIC PANEL WITH GFR
AG Ratio: 1.7 (calc) (ref 1.0–2.5)
ALT: 14 U/L (ref 6–29)
AST: 14 U/L (ref 10–35)
Albumin: 4.4 g/dL (ref 3.6–5.1)
Alkaline phosphatase (APISO): 50 U/L (ref 37–153)
BUN: 16 mg/dL (ref 7–25)
CO2: 30 mmol/L (ref 20–32)
Calcium: 9.6 mg/dL (ref 8.6–10.4)
Chloride: 104 mmol/L (ref 98–110)
Creat: 0.79 mg/dL (ref 0.50–1.05)
Globulin: 2.6 g/dL (calc) (ref 1.9–3.7)
Glucose, Bld: 88 mg/dL (ref 65–99)
Potassium: 4.3 mmol/L (ref 3.5–5.3)
Sodium: 141 mmol/L (ref 135–146)
Total Bilirubin: 0.5 mg/dL (ref 0.2–1.2)
Total Protein: 7 g/dL (ref 6.1–8.1)
eGFR: 82 mL/min/{1.73_m2} (ref >=60)

## 2021-12-22 LAB — CBC WITH DIFFERENTIAL/PLATELET
Absolute Monocytes: 428 cells/uL (ref 200–950)
Basophils Absolute: 31 cells/uL (ref 0–200)
Basophils Relative: 0.6 %
Eosinophils Absolute: 148 cells/uL (ref 15–500)
Eosinophils Relative: 2.9 %
HCT: 37.7 % (ref 35.0–45.0)
Hemoglobin: 12.9 g/dL (ref 11.7–15.5)
Lymphs Abs: 1443 cells/uL (ref 850–3900)
MCH: 33 pg (ref 27.0–33.0)
MCHC: 34.2 g/dL (ref 32.0–36.0)
MCV: 96.4 fL (ref 80.0–100.0)
MPV: 10.1 fL (ref 7.5–12.5)
Monocytes Relative: 8.4 %
Neutro Abs: 3050 cells/uL (ref 1500–7800)
Neutrophils Relative %: 59.8 %
Platelets: 237 10*3/uL (ref 140–400)
RBC: 3.91 10*6/uL (ref 3.80–5.10)
RDW: 12.4 % (ref 11.0–15.0)
Total Lymphocyte: 28.3 %
WBC: 5.1 10*3/uL (ref 3.8–10.8)

## 2021-12-22 LAB — URINALYSIS, ROUTINE W REFLEX MICROSCOPIC
Bacteria, UA: NONE SEEN /HPF
Bilirubin Urine: NEGATIVE
Glucose, UA: NEGATIVE
Hgb urine dipstick: NEGATIVE
Hyaline Cast: NONE SEEN /LPF
Ketones, ur: NEGATIVE
Nitrite: NEGATIVE
Protein, ur: NEGATIVE
RBC / HPF: NONE SEEN /HPF (ref 0–2)
Specific Gravity, Urine: 1.018 (ref 1.001–1.035)
Squamous Epithelial / HPF: NONE SEEN /HPF (ref <=5)
WBC, UA: NONE SEEN /HPF (ref 0–5)
pH: 7.5 (ref 5.0–8.0)

## 2021-12-22 LAB — C-REACTIVE PROTEIN: CRP: 0.8 mg/L (ref <8.0)

## 2021-12-22 LAB — URIC ACID: Uric Acid, Serum: 3.8 mg/dL (ref 2.5–7.0)

## 2021-12-22 LAB — SEDIMENTATION RATE: Sed Rate: 6 mm/h (ref 0–30)

## 2021-12-22 LAB — TSH: TSH: 1.32 mIU/L (ref 0.40–4.50)

## 2021-12-22 LAB — MICROSCOPIC MESSAGE

## 2021-12-22 NOTE — Progress Notes (Signed)
Yetta, labs are unrevealing it does not show any sign of inflammation or autoimmune disease that could be causing more systemic symptoms.  So really like to get you in with our sports med or Ortho to address some of your specific joint complaints maybe her hips and knees in particular to see what we can do.  I do recommend eating a low inflammatory diet.  I had quite a few patients notice improvement in their joint pain.  So it is mostly cutting back on a lot of processed foods and eating more fresh fruits and vegetables and lean proteins.

## 2021-12-30 ENCOUNTER — Ambulatory Visit (INDEPENDENT_AMBULATORY_CARE_PROVIDER_SITE_OTHER): Payer: Medicare (Managed Care) | Admitting: Sports Medicine

## 2021-12-30 ENCOUNTER — Ambulatory Visit (INDEPENDENT_AMBULATORY_CARE_PROVIDER_SITE_OTHER): Payer: Medicare (Managed Care)

## 2021-12-30 DIAGNOSIS — M160 Bilateral primary osteoarthritis of hip: Secondary | ICD-10-CM | POA: Insufficient documentation

## 2021-12-30 DIAGNOSIS — M170 Bilateral primary osteoarthritis of knee: Secondary | ICD-10-CM

## 2021-12-30 DIAGNOSIS — M25551 Pain in right hip: Secondary | ICD-10-CM | POA: Diagnosis not present

## 2021-12-30 DIAGNOSIS — M1711 Unilateral primary osteoarthritis, right knee: Secondary | ICD-10-CM | POA: Diagnosis not present

## 2021-12-30 DIAGNOSIS — M1712 Unilateral primary osteoarthritis, left knee: Secondary | ICD-10-CM | POA: Diagnosis not present

## 2021-12-30 MED ORDER — ACETAMINOPHEN ER 650 MG PO TBCR: 650.0000 mg | EXTENDED_RELEASE_TABLET | Freq: Three times a day (TID) | ORAL | 3 refills | Status: AC | PRN

## 2021-12-30 MED ORDER — CELECOXIB 200 MG PO CAPS: ORAL_CAPSULE | ORAL | 2 refills | Status: DC

## 2021-12-30 NOTE — Patient Instructions (Signed)

## 2021-12-30 NOTE — Assessment & Plan Note (Signed)
Widespread aches and pains, negative rheumatoid work-up, pain is in the groin worse with internal rotation, left worse than right. Adding bilateral hip x-rays, Celebrex as below, anti-inflammatory diet, home conditioning, arthritis strength Tylenol production return to see me in 6 weeks, injection if not better.

## 2021-12-30 NOTE — Assessment & Plan Note (Signed)
Bilateral anterior knee pain with crepitus, gelling. Suspect patellofemoral osteoarthritis, adding x-rays, switching from Mobic to Celebrex, she will do an anti-inflammatory diet, home conditioning, arthritis strength Tylenol. Return to see me in 6 weeks, injection if not better.

## 2021-12-30 NOTE — Progress Notes (Signed)
    Procedures performed today:    None.  Independent interpretation of notes and tests performed by another provider:   None.  Brief History, Exam, Impression, and Recommendations:    Osteoarthritis of patellofemoral joints, bilateral Bilateral anterior knee pain with crepitus, gelling. Suspect patellofemoral osteoarthritis, adding x-rays, switching from Mobic to Celebrex, she will do an anti-inflammatory diet, home conditioning, arthritis strength Tylenol. Return to see me in 6 weeks, injection if not better.  Primary osteoarthritis of both hips Widespread aches and pains, negative rheumatoid work-up, pain is in the groin worse with internal rotation, left worse than right. Adding bilateral hip x-rays, Celebrex as below, anti-inflammatory diet, home conditioning, arthritis strength Tylenol production return to see me in 6 weeks, injection if not better.    ____________________________________________ Ihor Austin. Benjamin Stain, M.D., ABFM., CAQSM., AME. Primary Care and Sports Medicine [facility]  Adjunct Professor of Family Medicine  [employer]  Restaurant manager, fast food

## 2022-01-04 ENCOUNTER — Ambulatory Visit (INDEPENDENT_AMBULATORY_CARE_PROVIDER_SITE_OTHER): Payer: Medicare (Managed Care) | Admitting: Family Medicine

## 2022-01-04 DIAGNOSIS — Z0000 Encounter for general adult medical examination without abnormal findings: Secondary | ICD-10-CM | POA: Diagnosis not present

## 2022-01-04 NOTE — Progress Notes (Signed)
MEDICARE ANNUAL WELLNESS VISIT  01/04/2022  Telephone Visit Disclaimer This Medicare AWV was conducted by telephone due to national recommendations for restrictions regarding the COVID-19 Pandemic (e.g. social distancing).  I verified, using two identifiers, that I am speaking with Desiree Hughes or their authorized healthcare agent. I discussed the limitations, risks, security, and privacy concerns of performing an evaluation and management service by telephone and the potential availability of an in-person appointment in the future. The patient expressed understanding and agreed to proceed.  Location of Patient: Home Location of Provider (nurse):  In the office.  Subjective:    Desiree Hughes is a 66 y.o. female patient of Metheney, Barbarann Ehlers, MD who had a Medicare Annual Wellness Visit today via telephone. Taje is Retired and lives with their spouse. she has 2 children. she reports that she is socially active and does interact with friends/family regularly. she is moderately physically active and enjoys playing with her dogs.  Patient Care Team: Agapito Games, MD as PCP - General (Family Medicine)     01/04/2022   11:05 AM 09/16/2020   12:31 PM 03/09/2017    3:15 PM 01/12/2016    8:54 AM 12/31/2014   10:24 AM 09/22/2014   11:08 AM  Advanced Directives  Does Patient Have a Medical Advance Directive? Yes No No No No No  Type of Advance Directive Living will       Does patient want to make changes to medical advance directive? No - Patient declined       Would patient like information on creating a medical advance directive?  Yes (MAU/Ambulatory/Procedural Areas - Information given) Yes (MAU/Ambulatory/Procedural Areas - Information given) No - patient declined information Yes - Educational materials given Yes - Educational materials given    Hospital Utilization Over the Past 12 Months: # of hospitalizations or ER visits: 0 # of surgeries: 0  Review of Systems    Patient  reports that her overall health is unchanged compared to last year.  History obtained from chart review and the patient  Patient Reported Readings (BP, Pulse, CBG, Weight, etc) none  Pain Assessment Pain : No/denies pain     Current Medications & Allergies (verified) Allergies as of 01/04/2022       Reactions   Lisinopril Cough        Medication List        Accurate as of January 04, 2022 11:24 AM. If you have any questions, ask your nurse or doctor.          acetaminophen 650 MG CR tablet Commonly known as: TYLENOL Take 1 tablet (650 mg total) by mouth every 8 (eight) hours as needed for pain.   AMBULATORY NON FORMULARY MEDICATION Medication Name: Needs new headgear, mask, tubing for her CPAP machine.  Please order through [facility]   ascorbic acid 500 MG tablet Commonly known as: VITAMIN C Take 500 mg by mouth daily.   BIOTIN PO Take by mouth.   CALCIUM PO Take by mouth.   celecoxib 200 MG capsule Commonly known as: CeleBREX One to 2 tablets by mouth daily as needed for pain.   CENTRUM SILVER ULTRA WOMENS PO Take by mouth.   clonazePAM 1 MG tablet Commonly known as: KLONOPIN Take 1 tablet (1 mg total) by mouth daily as needed for anxiety.   cyclobenzaprine 5 MG tablet Commonly known as: FLEXERIL TAKE 1 TABLET BY MOUTH AT BEDTIME AS NEEDED FOR MUSCLE SPASMS.   FISH OIL PO Take by mouth.  Flax Oil Take by mouth.   fluticasone 50 MCG/ACT nasal spray Commonly known as: FLONASE Place 1 spray into both nostrils daily.   HYDROcodone-acetaminophen 5-325 MG tablet Commonly known as: NORCO/VICODIN Take 1 tablet by mouth as needed for moderate pain.   MELATONIN PO Take by mouth.   MILK THISTLE PO Take by mouth.   nystatin-triamcinolone ointment Commonly known as: MYCOLOG Apply 1 application. topically 2 (two) times daily.   omeprazole 20 MG capsule Commonly known as: PRILOSEC Take 1 capsule (20 mg total) by mouth daily.   SUPER B COMPLEX  PO Take by mouth.   traZODone 50 MG tablet Commonly known as: DESYREL Take 3 tablets (150 mg total) by mouth at bedtime.   VITAMIN D-3 PO Take by mouth.        History (reviewed): Past Medical History:  Diagnosis Date   Herniated disc, cervical    Hypertension    Psoriasis    Past Surgical History:  Procedure Laterality Date   BREAST CYST ASPIRATION Left    TUBAL LIGATION     Family History  Problem Relation Age of Onset   Diabetes Mother    Hyperlipidemia Mother    Hypertension Mother    Alcohol abuse Father    Cancer Father    Heart disease Father    Diabetes Father    Hyperlipidemia Father    Hypertension Father    Heart disease Brother    Asthma Brother    Alcohol abuse Brother    Early death Brother    Depression Son    Diabetes Son    Healthy Son    Social History   Socioeconomic History   Marital status: Married    Spouse name: [name]   Number of children: 2   Years of education: 13   Highest education level: Some college, no degree  Occupational History   Occupation: Retired.  Tobacco Use   Smoking status: Never   Smokeless tobacco: Never  Vaping Use   Vaping Use: Never used  Substance and Sexual Activity   Alcohol use: Yes    Comment: Occasionally   Drug use: No   Sexual activity: Never  Other Topics Concern   Not on file  Social History Narrative   Lives with her husband. She enjoys playing her dogs.   Social Determinants of Health   Financial Resource Strain: Low Risk  (01/04/2022)   Overall Financial Resource Strain (CARDIA)    Difficulty of Paying Living Expenses: Not hard at all  Food Insecurity: No Food Insecurity (01/04/2022)   Hunger Vital Sign    Worried About Running Out of Food in the Last Year: Never true    Ran Out of Food in the Last Year: Never true  Transportation Needs: No Transportation Needs (01/04/2022)   PRAPARE - Administrator, Civil Service (Medical): No    Lack of Transportation (Non-Medical):  No  Physical Activity: Sufficiently Active (01/04/2022)   Exercise Vital Sign    Days of Exercise per Week: 7 days    Minutes of Exercise per Session: 40 min  Stress: No Stress Concern Present (01/04/2022)   [employer] of Occupational Health - Occupational Stress Questionnaire    Feeling of Stress : Only a little  Social Connections: Moderately Isolated (01/04/2022)   Social Connection and Isolation Panel [NHANES]    Frequency of Communication with Friends and Family: More than three times a week    Frequency of Social Gatherings with Friends and Family: More than  three times a week    Attends Religious Services: Never    Active Member of Clubs or Organizations: No    Attends Banker Meetings: Never    Marital Status: Married    Activities of Daily Living    01/04/2022   11:06 AM  In your present state of health, do you have any difficulty performing the following activities:  Hearing? 0  Vision? 0  Difficulty concentrating or making decisions? 0  Walking or climbing stairs? 0  Dressing or bathing? 0  Doing errands, shopping? 0  Preparing Food and eating ? N  Using the Toilet? N  In the past six months, have you accidently leaked urine? N  Do you have problems with loss of bowel control? N  Managing your Medications? N  Managing your Finances? N  Housekeeping or managing your Housekeeping? N    Patient Education/ Literacy How often do you need to have someone help you when you read instructions, pamphlets, or other written materials from your doctor or pharmacy?: 1 - Never What is the last grade level you completed in school?: Some college  Exercise Current Exercise Habits: Home exercise routine, Type of exercise: walking, Time (Minutes): 35, Frequency (Times/Week): 7, Weekly Exercise (Minutes/Week): 245, Intensity: Moderate, Exercise limited by: None identified  Diet Patient reports consuming  2-3  meals a day and 2 snack(s) a day Patient reports that  her primary diet is: Regular Patient reports that she does have regular access to food.   Depression Screen    01/04/2022   11:08 AM 12/21/2021   11:08 AM 07/14/2021    9:59 AM 09/16/2020   10:35 AM 11/04/2019   10:17 AM 09/24/2018    7:57 AM 03/25/2018    9:27 AM  PHQ 2/9 Scores  PHQ - 2 Score 0 0 2 2 4  0 0  PHQ- 9 Score   8 12 20  8      Fall Risk    01/04/2022   11:07 AM 12/21/2021   11:08 AM 07/14/2021    9:59 AM 09/16/2020   12:31 PM 03/09/2017    3:14 PM  Fall Risk   Falls in the past year? 1 1 0 0 No  Number falls in past yr: 0 0 0 0   Injury with Fall? 0 0 0 0   Risk for fall due to : History of fall(s) History of fall(s) No Fall Risks No Fall Risks   Follow up Falls evaluation completed;Education provided;Falls prevention discussed Falls evaluation completed Falls prevention discussed Falls prevention discussed;Falls evaluation completed      Objective:  Desiree Hughes seemed alert and oriented and she participated appropriately during our telephone visit.  Blood Pressure Weight BMI  BP Readings from Last 3 Encounters:  12/21/21 129/77  07/14/21 117/68  03/29/21 117/72   Wt Readings from Last 3 Encounters:  12/21/21 153 lb 1.3 oz (69.4 kg)  07/14/21 152 lb (68.9 kg)  03/29/21 154 lb (69.9 kg)   BMI Readings from Last 1 Encounters:  12/21/21 24.71 kg/m    *Unable to obtain current vital signs, weight, and BMI due to telephone visit type  Hearing/Vision  Casidee did not seem to have difficulty with hearing/understanding during the telephone conversation Reports that she has had a formal eye exam by an eye care professional within the past year Reports that she has not had a formal hearing evaluation within the past year *Unable to fully assess hearing and vision during telephone visit type  Cognitive  Function:    01/04/2022   11:21 AM  6CIT Screen  What Year? 0 points  What month? 0 points  What time? 0 points  Count back from 20 0 points  Months in reverse 0  points  Repeat phrase 0 points  Total Score 0 points   (Normal:0-7, Significant for Dysfunction: >8)  Normal Cognitive Function Screening: Yes   Immunization & Health Maintenance Record Immunization History  Administered Date(s) Administered   Influenza Inj Mdck Quad Pf 01/10/2019   Influenza, High Dose Seasonal PF 03/09/2017   Influenza,inj,Quad PF,6+ Mos 12/29/2014, 01/04/2016, 03/25/2018, 01/05/2021   Influenza-Unspecified 03/31/2014, 03/05/2020   PFIZER(Purple Top)SARS-COV-2 Vaccination 07/16/2019, 08/05/2019, 03/05/2020   PNEUMOCOCCAL CONJUGATE-20 09/16/2020   Pfizer Covid-19 Vaccine Bivalent Booster 11yrs & up 01/13/2021   Tdap 03/31/2014, 01/10/2020   Zoster Recombinat (Shingrix) 09/16/2020, 05/05/2021    Health Maintenance  Topic Date Due   DEXA SCAN  Never done   MAMMOGRAM  12/02/2021   COVID-19 Vaccine (5 - Pfizer series) 01/06/2022 (Originally 05/15/2021)   INFLUENZA VACCINE  07/23/2022 (Originally 11/22/2021)   COLON CANCER SCREENING ANNUAL FOBT  07/21/2022   TETANUS/TDAP  01/09/2030   Pneumonia Vaccine 26+ Years old  Completed   Hepatitis C Screening  Completed   Zoster Vaccines- Shingrix  Completed   HPV VACCINES  Aged Out   COLONOSCOPY (Pts 45-2yrs Insurance coverage will need to be confirmed)  Discontinued       Assessment  This is a routine wellness examination for [employer].  Health Maintenance: Due or Overdue Health Maintenance Due  Topic Date Due   DEXA SCAN  Never done   MAMMOGRAM  12/02/2021    Desiree Hughes does not need a referral for Community Assistance: Care Management:   no Social Work:    no Prescription Assistance:  no Nutrition/Diabetes Education:  no   Plan:  Personalized Goals  Goals Addressed             This Visit's Progress    Patient Stated       Would like to loose 10 lbs.       Personalized Health Maintenance & Screening Recommendations  Screening mammography Bone densitometry screening Influenza  vaccine  Patient is scheduled for mammogram and dexa scan.  Lung Cancer Screening Recommended: no (Low Dose CT Chest recommended if Age 4-80 years, 30 pack-year currently smoking OR have quit w/in past 15 years) Hepatitis C Screening recommended: no HIV Screening recommended: no  Advanced Directives: Written information was not prepared per patient's request.  Referrals & Orders No orders of the defined types were placed in this encounter.   Follow-up Plan Follow-up with Agapito Games, MD as planned Medicare wellness visit in one year.  Patient will access AVS on my chart.   I have personally reviewed and noted the following in the patient's chart:   Medical and social history Use of alcohol, tobacco or illicit drugs  Current medications and supplements Functional ability and status Nutritional status Physical activity Advanced directives List of other physicians Hospitalizations, surgeries, and ER visits in previous 12 months Vitals Screenings to include cognitive, depression, and falls Referrals and appointments  In addition, I have reviewed and discussed with Desiree Hughes certain preventive protocols, quality metrics, and best practice recommendations. A written personalized care plan for preventive services as well as general preventive health recommendations is available and can be mailed to the patient at her request.      Modesto Charon, RN BSN  01/04/2022

## 2022-01-04 NOTE — Patient Instructions (Addendum)
MEDICARE ANNUAL WELLNESS VISIT Health Maintenance Summary and Written Plan of Care  [name] ,  Thank you for allowing me to perform your Medicare Annual Wellness Visit and for your ongoing commitment to your health.   Health Maintenance & Immunization History Health Maintenance  Topic Date Due   COVID-19 Vaccine (5 - Pfizer series) 01/06/2022 (Originally 05/15/2021)   INFLUENZA VACCINE  07/23/2022 (Originally 11/22/2021)   MAMMOGRAM  01/05/2023 (Originally 12/02/2021)   DEXA SCAN  01/05/2023 (Originally 08/12/2019)   COLON CANCER SCREENING ANNUAL FOBT  07/21/2022   TETANUS/TDAP  01/09/2030   Pneumonia Vaccine 41+ Years old  Completed   Hepatitis C Screening  Completed   Zoster Vaccines- Shingrix  Completed   HPV VACCINES  Aged Out   COLONOSCOPY (Pts 45-63yrs Insurance coverage will need to be confirmed)  Discontinued   Immunization History  Administered Date(s) Administered   Influenza Inj Mdck Quad Pf 01/10/2019   Influenza, High Dose Seasonal PF 03/09/2017   Influenza,inj,Quad PF,6+ Mos 12/29/2014, 01/04/2016, 03/25/2018, 01/05/2021   Influenza-Unspecified 03/31/2014, 03/05/2020   PFIZER(Purple Top)SARS-COV-2 Vaccination 07/16/2019, 08/05/2019, 03/05/2020   PNEUMOCOCCAL CONJUGATE-20 09/16/2020   Pfizer Covid-19 Vaccine Bivalent Booster 49yrs & up 01/13/2021   Tdap 03/31/2014, 01/10/2020   Zoster Recombinat (Shingrix) 09/16/2020, 05/05/2021    These are the patient goals that we discussed:  Goals Addressed             This Visit's Progress    Patient Stated       Would like to loose 10 lbs.         This is a list of Health Maintenance Items that are overdue or due now: Screening mammography Bone densitometry screening Influenza vaccine  Patient is scheduled for mammogram and dexa scan.    Orders/Referrals Placed Today: No orders of the defined types were placed in this encounter.  (Contact our referral department at [phone] if you have not spoken  with someone about your referral appointment within the next 5 days)    Follow-up Plan Follow-up with Agapito Games, MD as planned Medicare wellness visit in one year.  Patient will access AVS on my chart.      Health Maintenance, Female Adopting a healthy lifestyle and getting preventive care are important in promoting health and wellness. Ask your health care provider about: The right schedule for you to have regular tests and exams. Things you can do on your own to prevent diseases and keep yourself healthy. What should I know about diet, weight, and exercise? Eat a healthy diet  Eat a diet that includes plenty of vegetables, fruits, low-fat dairy products, and lean protein. Do not eat a lot of foods that are high in solid fats, added sugars, or sodium. Maintain a healthy weight Body mass index (BMI) is used to identify weight problems. It estimates body fat based on height and weight. Your health care provider can help determine your BMI and help you achieve or maintain a healthy weight. Get regular exercise Get regular exercise. This is one of the most important things you can do for your health. Most adults should: Exercise for at least 150 minutes each week. The exercise should increase your heart rate and make you sweat (moderate-intensity exercise). Do strengthening exercises at least twice a week. This is in addition to the moderate-intensity exercise. Spend less time sitting. Even light physical activity can be beneficial. Watch cholesterol and blood lipids Have your blood tested for lipids and cholesterol at 67 years of age, then have  this test every 5 years. Have your cholesterol levels checked more often if: Your lipid or cholesterol levels are high. You are older than 67 years of age. You are at high risk for heart disease. What should I know about cancer screening? Depending on your health history and family history, you may need to have cancer screening at  various ages. This may include screening for: Breast cancer. Cervical cancer. Colorectal cancer. Skin cancer. Lung cancer. What should I know about heart disease, diabetes, and high blood pressure? Blood pressure and heart disease High blood pressure causes heart disease and increases the risk of stroke. This is more likely to develop in people who have high blood pressure readings or are overweight. Have your blood pressure checked: Every 3-5 years if you are 36-71 years of age. Every year if you are 81 years old or older. Diabetes Have regular diabetes screenings. This checks your fasting blood sugar level. Have the screening done: Once every three years after age 28 if you are at a normal weight and have a low risk for diabetes. More often and at a younger age if you are overweight or have a high risk for diabetes. What should I know about preventing infection? Hepatitis B If you have a higher risk for hepatitis B, you should be screened for this virus. Talk with your health care provider to find out if you are at risk for hepatitis B infection. Hepatitis C Testing is recommended for: Everyone born from [birth year] through 1966. Anyone with known risk factors for hepatitis C. Sexually transmitted infections (STIs) Get screened for STIs, including gonorrhea and chlamydia, if: You are sexually active and are younger than 67 years of age. You are older than 67 years of age and your health care provider tells you that you are at risk for this type of infection. Your sexual activity has changed since you were last screened, and you are at increased risk for chlamydia or gonorrhea. Ask your health care provider if you are at risk. Ask your health care provider about whether you are at high risk for HIV. Your health care provider may recommend a prescription medicine to help prevent HIV infection. If you choose to take medicine to prevent HIV, you should first get tested for HIV. You should then be  tested every 3 months for as long as you are taking the medicine. Pregnancy If you are about to stop having your period (premenopausal) and you may become pregnant, seek counseling before you get pregnant. Take 400 to 800 micrograms (mcg) of folic acid every day if you become pregnant. Ask for birth control (contraception) if you want to prevent pregnancy. Osteoporosis and menopause Osteoporosis is a disease in which the bones lose minerals and strength with aging. This can result in bone fractures. If you are 23 years old or older, or if you are at risk for osteoporosis and fractures, ask your health care provider if you should: Be screened for bone loss. Take a calcium or vitamin D supplement to lower your risk of fractures. Be given hormone replacement therapy (HRT) to treat symptoms of menopause. Follow these instructions at home: Alcohol use Do not drink alcohol if: Your health care provider tells you not to drink. You are pregnant, may be pregnant, or are planning to become pregnant. If you drink alcohol: Limit how much you have to: 0-1 drink a day. Know how much alcohol is in your drink. In the [place] one drink equals one 12 oz bottle of  beer (355 mL), one 5 oz glass of wine (148 mL), or one 1 oz glass of hard liquor (44 mL). Lifestyle Do not use any products that contain nicotine or tobacco. These products include cigarettes, chewing tobacco, and vaping devices, such as e-cigarettes. If you need help quitting, ask your health care provider. Do not use street drugs. Do not share needles. Ask your health care provider for help if you need support or information about quitting drugs. General instructions Schedule regular health, dental, and eye exams. Stay current with your vaccines. Tell your health care provider if: You often feel depressed. You have ever been abused or do not feel safe at home. Summary Adopting a healthy lifestyle and getting preventive care are important in  promoting health and wellness. Follow your health care provider's instructions about healthy diet, exercising, and getting tested or screened for diseases. Follow your health care provider's instructions on monitoring your cholesterol and blood pressure. This information is not intended to replace advice given to you by your health care provider. Make sure you discuss any questions you have with your health care provider. Document Revised: 08/30/2020 Document Reviewed: 08/30/2020 Elsevier Patient Education  [place].

## 2022-01-05 ENCOUNTER — Ambulatory Visit (INDEPENDENT_AMBULATORY_CARE_PROVIDER_SITE_OTHER): Payer: Medicare (Managed Care)

## 2022-01-05 DIAGNOSIS — Z1231 Encounter for screening mammogram for malignant neoplasm of breast: Secondary | ICD-10-CM | POA: Diagnosis not present

## 2022-01-06 NOTE — Progress Notes (Signed)
Please call patient. Normal mammogram.  Repeat in 1 year.  

## 2022-01-11 ENCOUNTER — Ambulatory Visit (INDEPENDENT_AMBULATORY_CARE_PROVIDER_SITE_OTHER): Payer: Medicare (Managed Care)

## 2022-01-11 ENCOUNTER — Encounter: Payer: Self-pay | Admitting: Family Medicine

## 2022-01-11 DIAGNOSIS — M8580 Other specified disorders of bone density and structure, unspecified site: Secondary | ICD-10-CM

## 2022-01-11 DIAGNOSIS — M85851 Other specified disorders of bone density and structure, right thigh: Secondary | ICD-10-CM | POA: Diagnosis not present

## 2022-01-11 DIAGNOSIS — Z780 Asymptomatic menopausal state: Secondary | ICD-10-CM

## 2022-01-11 NOTE — Progress Notes (Signed)
Hi Marnita, your bone density test shows that you have mildly thin bones called osteopenia.   The current recommendation for osteopenia (mildly thin bones) treatment includes:   #1 calcium-total of 1200 mg of calcium daily.  If you eat a very calcium rich diet you may be able to obtain that without a supplement.  If not, then I recommend calcium 500 mg twice a day.  There are several products over-the-counter such as Caltrate D and Viactiv chews which are great options that contain calcium and vitamin D. #2 vitamin D-recommend 800 international units daily. #3 exercise-recommend 30 minutes of weightbearing exercise 3 days a week.  Resistance training ,such as doing bands and light weights, can be particularly helpful.

## 2022-02-13 ENCOUNTER — Ambulatory Visit (INDEPENDENT_AMBULATORY_CARE_PROVIDER_SITE_OTHER): Payer: Medicare (Managed Care) | Admitting: Sports Medicine

## 2022-02-13 DIAGNOSIS — M160 Bilateral primary osteoarthritis of hip: Secondary | ICD-10-CM

## 2022-02-13 DIAGNOSIS — M170 Bilateral primary osteoarthritis of knee: Secondary | ICD-10-CM

## 2022-02-13 NOTE — Assessment & Plan Note (Signed)
Doing a lot better with Celebrex. X-rays did show osteoarthritis, rheumatoid work-up negative. Added aggressive hip strengthening exercises, return as needed.

## 2022-02-13 NOTE — Progress Notes (Signed)
    Procedures performed today:    None.  Independent interpretation of notes and tests performed by another provider:   None.  Brief History, Exam, Impression, and Recommendations:    Primary osteoarthritis of both hips Doing a lot better with Celebrex. X-rays did show osteoarthritis, rheumatoid work-up negative. Added aggressive hip strengthening exercises, return as needed.  Osteoarthritis of patellofemoral joints, bilateral Rheumatoid work-up negative, x-ray confirmed osteoarthritis. Much better with Celebrex, added aggressive strengthening exercises, return as needed.    ____________________________________________ Gwen Her. Dianah Field, M.D., ABFM., CAQSM., AME. Primary Care and Sports Medicine [facility]  Adjunct Professor of [place] [employer] [facility] of Medicine  Risk manager

## 2022-02-13 NOTE — Assessment & Plan Note (Signed)
Rheumatoid work-up negative, x-ray confirmed osteoarthritis. Much better with Celebrex, added aggressive strengthening exercises, return as needed.

## 2022-02-22 ENCOUNTER — Other Ambulatory Visit: Payer: Self-pay

## 2022-02-22 DIAGNOSIS — M170 Bilateral primary osteoarthritis of knee: Secondary | ICD-10-CM

## 2022-02-22 MED ORDER — CELECOXIB 200 MG PO CAPS: ORAL_CAPSULE | ORAL | 2 refills | Status: DC

## 2022-03-03 ENCOUNTER — Ambulatory Visit (INDEPENDENT_AMBULATORY_CARE_PROVIDER_SITE_OTHER): Payer: Medicare (Managed Care) | Admitting: Family Medicine

## 2022-03-03 ENCOUNTER — Encounter: Payer: Self-pay | Admitting: Family Medicine

## 2022-03-03 VITALS — BP 117/68 | HR 64 | Temp 98.5°F | Ht 66.0 in | Wt 153.0 lb

## 2022-03-03 DIAGNOSIS — Z23 Encounter for immunization: Secondary | ICD-10-CM | POA: Diagnosis not present

## 2022-03-03 DIAGNOSIS — L299 Pruritus, unspecified: Secondary | ICD-10-CM

## 2022-03-03 DIAGNOSIS — R682 Dry mouth, unspecified: Secondary | ICD-10-CM | POA: Diagnosis not present

## 2022-03-03 DIAGNOSIS — H60543 Acute eczematoid otitis externa, bilateral: Secondary | ICD-10-CM | POA: Diagnosis not present

## 2022-03-03 DIAGNOSIS — J019 Acute sinusitis, unspecified: Secondary | ICD-10-CM

## 2022-03-03 MED ORDER — FLUOCINOLONE ACETONIDE 0.01 % OT OIL: 2.0000 [drp] | TOPICAL_OIL | Freq: Every day | OTIC | 1 refills | Status: DC | PRN

## 2022-03-03 MED ORDER — AMOXICILLIN-POT CLAVULANATE 875-125 MG PO TABS: 1.0000 | ORAL_TABLET | Freq: Two times a day (BID) | ORAL | 0 refills | Status: DC

## 2022-03-03 NOTE — Patient Instructions (Signed)
They do make some mouth rinses and lozenges that can help with dry mouth.  Also running a humidifier can be helpful.  You could consider avoiding the antihistamine for period of time

## 2022-03-03 NOTE — Progress Notes (Signed)
Pt reports that for the past few months whenever she bends over to pick up something she gets an awful smell and doesn't know where its coming from. [place] but that wasn't the case.

## 2022-03-03 NOTE — Progress Notes (Signed)
Acute Office Visit  Subjective:     Patient ID: Desiree Hughes, female    DOB: November 19, 1955, 66 y.o.   MRN: [id number]  Chief Complaint  Patient presents with   Sinusitis    HPI Patient is in today nasal sxs.  When bends over gets a fould smell x 2-3 months.  Thought maybe allergies she uses Flonase to healing and takes a antihistamine tab daily.  Getting pressure in her head and base of the skull.  No sore throat or ear pain or pressure.  Is also noticed that she had a lot more dry mouth than usual.  Before she would just notice it occasionally but now it is much more frequent to the point that her front lip is been catching on her front teeth where there is a slight line meant of the ends of the teeth.   Getting a lot of ear itching in the canal and dry skin in the canals.   ROS      Objective:    BP 117/68   Pulse 64   Temp 98.5 F (36.9 C)   Ht 5\' 6"  (1.676 m)   Wt 153 lb (69.4 kg)   SpO2 100%   BMI 24.69 kg/m    Physical Exam Constitutional:      Appearance: She is well-developed.  HENT:     Head: Normocephalic and atraumatic.     Right Ear: Tympanic membrane, ear canal and external ear normal.     Left Ear: Tympanic membrane, ear canal and external ear normal.     Nose: Nose normal.     Mouth/Throat:     Pharynx: Oropharynx is clear.  Eyes:     Conjunctiva/sclera: Conjunctivae normal.     Pupils: Pupils are equal, round, and reactive to light.  Neck:     Thyroid: No thyromegaly.  Cardiovascular:     Rate and Rhythm: Normal rate and regular rhythm.     Heart sounds: Normal heart sounds.  Pulmonary:     Effort: Pulmonary effort is normal.     Breath sounds: Normal breath sounds. No wheezing.  Musculoskeletal:     Cervical back: Neck supple. No tenderness.  Lymphadenopathy:     Cervical: No cervical adenopathy.  Skin:    General: Skin is warm and dry.  Neurological:     Mental Status: She is alert and oriented to person, place, and time.     No  results found for any visits on 03/03/22.      Assessment & Plan:   Problem List Items Addressed This Visit   None Visit Diagnoses     Need for immunization against influenza    -  Primary   Relevant Orders   Flu Vaccine QUAD High Dose(Fluad) (Completed)   Encounter for immunization       Relevant Orders   Pfizer Fall 2023 Covid-19 Vaccine 19yrs and older (Completed)   Itching of ear       Eczema of both external ears       Relevant Medications   Fluocinolone Acetonide 0.01 % OIL   Acute non-recurrent sinusitis, unspecified location       Relevant Medications   amoxicillin-clavulanate (AUGMENTIN) 875-125 MG tablet   Dry mouth          Acute sinusitis-suspect sinusitis.  Will treat doxycycline.  If not better then recommend ENT referral since she does not have all the classic symptoms.  Consider other causes such as fungal infection etc.  Dry Mouth -  They do make some mouth rinses and lozenges that can help with dry mouth.  Also running a humidifier can be helpful.  You could consider avoiding the antihistamine for period of time.  Some of both ear canals-we will treat with Derm otic.  Do not use for more than 2 weeks consecutively.  Meds ordered this encounter  Medications   Fluocinolone Acetonide 0.01 % OIL    Sig: Place 2 drops in ear(s) daily as needed (both ears).    Dispense:  20 mL    Refill:  1   amoxicillin-clavulanate (AUGMENTIN) 875-125 MG tablet    Sig: Take 1 tablet by mouth 2 (two) times daily.    Dispense:  20 tablet    Refill:  0    No follow-ups on file.  Nani Gasser, MD

## 2022-03-22 ENCOUNTER — Other Ambulatory Visit: Payer: Self-pay | Admitting: Neurology

## 2022-07-21 ENCOUNTER — Other Ambulatory Visit: Payer: Self-pay | Admitting: Family Medicine

## 2022-07-21 DIAGNOSIS — F5101 Primary insomnia: Secondary | ICD-10-CM

## 2022-07-21 DIAGNOSIS — K219 Gastro-esophageal reflux disease without esophagitis: Secondary | ICD-10-CM

## 2022-09-20 ENCOUNTER — Ambulatory Visit (INDEPENDENT_AMBULATORY_CARE_PROVIDER_SITE_OTHER): Payer: Medicare (Managed Care)

## 2022-09-20 ENCOUNTER — Ambulatory Visit: Payer: Self-pay

## 2022-09-20 ENCOUNTER — Ambulatory Visit (INDEPENDENT_AMBULATORY_CARE_PROVIDER_SITE_OTHER): Payer: Medicare (Managed Care) | Admitting: Sports Medicine

## 2022-09-20 DIAGNOSIS — M160 Bilateral primary osteoarthritis of hip: Secondary | ICD-10-CM

## 2022-09-20 DIAGNOSIS — M65331 Trigger finger, right middle finger: Secondary | ICD-10-CM | POA: Diagnosis not present

## 2022-09-20 DIAGNOSIS — M170 Bilateral primary osteoarthritis of knee: Secondary | ICD-10-CM | POA: Diagnosis not present

## 2022-09-20 MED ORDER — CELECOXIB 200 MG PO CAPS
ORAL_CAPSULE | ORAL | 23 refills | Status: DC
Start: 2022-09-20 — End: 2023-11-12

## 2022-09-20 NOTE — Assessment & Plan Note (Signed)
Increasing pain and triggering right middle finger, not better with home physical therapy, we did a trigger finger injection today, return to see me in 6 weeks for this.

## 2022-09-20 NOTE — Assessment & Plan Note (Signed)
Increasing pain right hip not controlled with Celebrex, hip joint injection today, return in 6 weeks, continue home physical therapy.

## 2022-09-20 NOTE — Progress Notes (Signed)
    Procedures performed today:    Procedure: Real-time Ultrasound Guided injection of the right hip joint Device: Samsung HS60  Verbal informed consent obtained.  Time-out conducted.  Noted no overlying erythema, induration, or other signs of local infection.  Skin prepped in a sterile fashion.  Local anesthesia: Topical Ethyl chloride.  With sterile technique and under real time ultrasound guidance: Arthritic joint noted, 1 cc Kenalog 40, 2 cc lidocaine, 2 cc bupivacaine injected easily Completed without difficulty  Advised to call if fevers/chills, erythema, induration, drainage, or persistent bleeding.  Images permanently stored and available for review in PACS.  Impression: Technically successful ultrasound guided injection.  Procedure: Real-time Ultrasound Guided injection of the right third flexor tendon sheath Device: Samsung HS60  Verbal informed consent obtained.  Time-out conducted.  Noted no overlying erythema, induration, or other signs of local infection.  Skin prepped in a sterile fashion.  Local anesthesia: Topical Ethyl chloride.  With sterile technique and under real time ultrasound guidance: Flexor nodule seen, 1/2 cc lidocaine, 1/2 cc kenalog 40 injected easily Completed without difficulty  Advised to call if fevers/chills, erythema, induration, drainage, or persistent bleeding.  Images permanently stored and available for review in PACS.  Impression: Technically successful ultrasound guided injection.  Independent interpretation of notes and tests performed by another provider:   None.  Brief History, Exam, Impression, and Recommendations:    Primary osteoarthritis of both hips Increasing pain right hip not controlled with Celebrex, hip joint injection today, return in 6 weeks, continue home physical therapy.  Trigger finger, right middle finger Increasing pain and triggering right middle finger, not better with home physical therapy, we did a trigger  finger injection today, return to see me in 6 weeks for this.    ____________________________________________ Ihor Austin. Benjamin Stain, M.D., ABFM., CAQSM., AME. Primary Care and Sports Medicine [facility]  Adjunct Professor of Family Medicine  [employer]  Restaurant manager, fast food

## 2022-10-18 ENCOUNTER — Ambulatory Visit (INDEPENDENT_AMBULATORY_CARE_PROVIDER_SITE_OTHER): Payer: Medicare (Managed Care) | Admitting: Sports Medicine

## 2022-10-18 DIAGNOSIS — M65331 Trigger finger, right middle finger: Secondary | ICD-10-CM

## 2022-10-18 DIAGNOSIS — M47812 Spondylosis without myelopathy or radiculopathy, cervical region: Secondary | ICD-10-CM | POA: Diagnosis not present

## 2022-10-18 DIAGNOSIS — M160 Bilateral primary osteoarthritis of hip: Secondary | ICD-10-CM | POA: Diagnosis not present

## 2022-10-18 MED ORDER — CYCLOBENZAPRINE HCL 10 MG PO TABS: ORAL_TABLET | ORAL | 0 refills | Status: DC

## 2022-10-18 NOTE — Assessment & Plan Note (Signed)
Has historically sought treatment at [facility], she did have some spinal injections in the past, she had seen the C4 and C4-C5 epidurals, MRI is from 1 to 2 years ago. We will repeat epidurals, this time C6-C7 with [facility] imaging, and I am happy to stacked 3 epidurals 1 month apart before considering trying facet joint injections. Adding some muscle relaxers and she will continue getting her hydrocodone from an outside provider.

## 2022-10-18 NOTE — Assessment & Plan Note (Signed)
Much better after injection at the last visit.

## 2022-10-18 NOTE — Assessment & Plan Note (Signed)
Right hip pain much better after injection at the last visit.

## 2022-10-18 NOTE — Progress Notes (Signed)
    Procedures performed today:    None.  Independent interpretation of notes and tests performed by another provider:   None.  Brief History, Exam, Impression, and Recommendations:    Cervical spondylosis Has historically sought treatment at [facility], she did have some spinal injections in the past, she had seen the C4 and C4-C5 epidurals, MRI is from 1 to 2 years ago. We will repeat epidurals, this time C6-C7 with [facility] imaging, and I am happy to stacked 3 epidurals 1 month apart before considering trying facet joint injections. Adding some muscle relaxers and she will continue getting her hydrocodone from an outside provider.  Primary osteoarthritis of both hips Right hip pain much better after injection at the last visit.  Trigger finger, right middle finger Much better after injection at the last visit.  I spent 30 minutes of total time managing this patient today, this includes chart review, face to face, and non-face to face time.  ____________________________________________ Ihor Austin. Benjamin Stain, M.D., ABFM., CAQSM., AME. Primary Care and Sports Medicine [facility]  Adjunct Professor of Family Medicine  [employer] [facility] [employer]  Restaurant manager, fast food

## 2022-10-19 ENCOUNTER — Other Ambulatory Visit: Payer: Self-pay | Admitting: Family Medicine

## 2022-10-19 DIAGNOSIS — K219 Gastro-esophageal reflux disease without esophagitis: Secondary | ICD-10-CM

## 2022-10-19 DIAGNOSIS — F5101 Primary insomnia: Secondary | ICD-10-CM

## 2022-10-27 ENCOUNTER — Telehealth: Payer: Self-pay | Admitting: Family Medicine

## 2022-10-27 NOTE — Telephone Encounter (Signed)
Just have her pay cash, it is cheap everywhere, also have her look up the prescription on good [facility] and have the insurance company run it through the coupon.

## 2022-10-27 NOTE — Telephone Encounter (Signed)
Pt called because her cyclobenzaprine (FLEXERIL) 10 MG tablet  needs a PA before the fill this.

## 2022-10-30 NOTE — Telephone Encounter (Signed)
I called pt and she went ahead and got the medication this time but states that if we go ahead and do an auth, that its usually free or around $5 verses the $20. She is requesting we get the Auth for her to use next time.

## 2022-10-31 ENCOUNTER — Other Ambulatory Visit: Payer: Self-pay | Admitting: Family Medicine

## 2022-10-31 DIAGNOSIS — J301 Allergic rhinitis due to pollen: Secondary | ICD-10-CM

## 2022-11-01 ENCOUNTER — Telehealth: Payer: Self-pay

## 2022-11-01 NOTE — Telephone Encounter (Signed)
Initiated Prior authorization ZOX:[id number] HCl 10MG  tablets  Via: Covermymeds Case/Key:[id number] Status: n/a as of 11/01/22 Reason:PA was already submitted for this patient and drug which was denied unknown user. Notified Pt via: Mychart

## 2022-12-08 ENCOUNTER — Encounter: Payer: Self-pay | Admitting: Emergency Medicine

## 2022-12-08 ENCOUNTER — Ambulatory Visit
Admission: EM | Admit: 2022-12-08 | Discharge: 2022-12-08 | Disposition: A | Discharge status: Home / Self Care | Payer: Medicare (Managed Care) | Attending: Family Medicine | Admitting: Family Medicine

## 2022-12-08 DIAGNOSIS — W540XXA Bitten by dog, initial encounter: Secondary | ICD-10-CM | POA: Diagnosis not present

## 2022-12-08 DIAGNOSIS — S61452A Open bite of left hand, initial encounter: Secondary | ICD-10-CM | POA: Diagnosis not present

## 2022-12-08 MED ORDER — AMOXICILLIN-POT CLAVULANATE 875-125 MG PO TABS: ORAL_TABLET | ORAL | 0 refills | Status: DC

## 2022-12-08 NOTE — ED Triage Notes (Signed)
Patient received a dog bite on left hand near 5th finger today.  The dogs were her own dogs and are current on shots.  Last Tdap 2021.

## 2022-12-08 NOTE — ED Provider Notes (Signed)
[facility]    CSN: [id number] Arrival date & time: 12/08/22  1110      History   Chief Complaint Chief Complaint  Patient presents with   Animal Bite    HPI Desiree Hughes is a 67 y.o. female.   Patient reports that her two dogs began snapping at each other today, and she received a superficial bite on the dorsum of her left hand.  Her dogs are up-to-date on their rabies vaccinations, and patient's last Tdap was 2021.  The history is provided by the patient.  Animal Bite Contact animal:  Dog Location:  Hand Hand injury location:  Dorsum of L hand Time since incident:  2 hours Pain details:    Quality:  Aching   Severity:  Mild   Timing:  Constant   Progression:  Unchanged Incident location:  Home Provoked: provoked   Notifications:  None Animal's rabies vaccination status:  Up to date Animal in possession: yes   Tetanus status:  Up to date Relieved by:  None tried Worsened by:  Nothing Ineffective treatments:  None tried Associated symptoms: no numbness and no swelling     Past Medical History:  Diagnosis Date   Herniated disc, cervical    Hypertension    Psoriasis     Patient Active Problem List   Diagnosis Date Noted   Osteopenia 01/11/2022   Primary osteoarthritis of both hips 12/30/2021   Acute right-sided low back pain 03/29/2021   Trigger finger, right middle finger 03/29/2021   Osteoarthritis of patellofemoral joints, bilateral 11/23/2020   Cervical spondylosis 10/28/2020   Grief 11/04/2019   Insomnia 02/08/2016   GAD (generalized anxiety disorder) 12/29/2014   GERD (gastroesophageal reflux disease) 10/07/2014   Chronic pain syndrome 10/07/2014   Bilateral carpal tunnel syndrome 09/22/2014   Obstructive sleep apnea 09/22/2014   Psoriasis 09/22/2014    Past Surgical History:  Procedure Laterality Date   BREAST CYST ASPIRATION Left    TUBAL LIGATION      OB History   No obstetric history on file.      Home Medications     Prior to Admission medications   Medication Sig Start Date End Date Taking? Authorizing Provider  AMBULATORY NON FORMULARY MEDICATION Medication Name: Needs new headgear, mask, tubing for her CPAP machine.  Please order through [facility] 09/18/17  Yes Agapito Games, MD  amoxicillin-clavulanate (AUGMENTIN) 875-125 MG tablet Take one tab PO Q12hr PC 12/08/22  Yes Lattie Haw, MD  B Complex-C (SUPER B COMPLEX PO) Take by mouth.   Yes [provider]  BIOTIN PO Take by mouth.   Yes [provider]  CALCIUM PO Take by mouth.   Yes [provider]  celecoxib (CELEBREX) 200 MG capsule One to 2 tablets by mouth daily as needed for pain. 09/20/22  Yes Monica Becton, MD  Cholecalciferol (VITAMIN D-3 PO) Take by mouth.   Yes [provider]  clonazePAM (KLONOPIN) 1 MG tablet Take 1 tablet (1 mg total) by mouth daily as needed for anxiety. 07/14/21  Yes Agapito Games, MD  cyclobenzaprine (FLEXERIL) 10 MG tablet One half to one tab PO qHS, then increase gradually to one tab TID. 10/18/22  Yes Monica Becton, MD  Flax OIL Take by mouth.   Yes [provider]  Fluocinolone Acetonide 0.01 % OIL Place 2 drops in ear(s) daily as needed (both ears). 03/03/22  Yes Agapito Games, MD  fluticasone (FLONASE) 50 MCG/ACT nasal spray USE 1  SPRAY IN EACH NOSTRIL DAILY 10/31/22  Yes Agapito Games, MD  HYDROcodone-acetaminophen (NORCO/VICODIN) 5-325 MG tablet Take 1 tablet by mouth as needed for moderate pain.   Yes [provider]  MELATONIN PO Take by mouth.   Yes [provider]  MILK THISTLE PO Take by mouth.   Yes [provider]  Multiple Vitamins-Minerals (CENTRUM SILVER ULTRA WOMENS PO) Take by mouth.   Yes [provider]  nystatin-triamcinolone ointment (MYCOLOG) Apply 1 application. topically 2 (two) times daily. 09/06/21  Yes Agapito Games, MD  Omega-3 Fatty Acids (FISH OIL PO) Take  by mouth.   Yes [provider]  omeprazole (PRILOSEC) 20 MG capsule TAKE 1 CAPSULE DAILY 10/19/22  Yes Agapito Games, MD  traZODone (DESYREL) 50 MG tablet TAKE 3 TABLETS AT BEDTIME 10/19/22  Yes Agapito Games, MD  vitamin C (ASCORBIC ACID) 500 MG tablet Take 500 mg by mouth daily.   Yes [provider]  acetaminophen (TYLENOL) 650 MG CR tablet Take 1 tablet (650 mg total) by mouth every 8 (eight) hours as needed for pain. 12/30/21   Monica Becton, MD    Family History Family History  Problem Relation Age of Onset   Diabetes Mother    Hyperlipidemia Mother    Hypertension Mother    Alcohol abuse Father    Cancer Father    Heart disease Father    Diabetes Father    Hyperlipidemia Father    Hypertension Father    Heart disease Brother    Asthma Brother    Alcohol abuse Brother    Early death Brother    Depression Son    Diabetes Son    Healthy Son     Social History Social History   Tobacco Use   Smoking status: Never   Smokeless tobacco: Never  Vaping Use   Vaping status: Never Used  Substance Use Topics   Alcohol use: Yes    Comment: Occasionally   Drug use: No     Allergies   Lisinopril   Review of Systems Review of Systems  Musculoskeletal:  Negative for arthralgias and joint swelling.  Skin:  Positive for wound. Negative for color change.  Neurological:  Negative for numbness.  All other systems reviewed and are negative.    Physical Exam Triage Vital Signs ED Triage Vitals  Encounter Vitals Group     BP 12/08/22 1236 137/83     Systolic BP Percentile --      Diastolic BP Percentile --      Pulse Rate 12/08/22 1236 68     Resp 12/08/22 1236 18     Temp 12/08/22 1236 (!) 97.5 F (36.4 C)     Temp Source 12/08/22 1236 Oral     SpO2 12/08/22 1236 98 %     Weight 12/08/22 1240 148 lb (67.1 kg)     Height 12/08/22 1240 5\' 6"  (1.676 m)     Head Circumference --      Peak Flow --      Pain Score 12/08/22 1239  2     Pain Loc --      Pain Education --      Exclude from Growth Chart --    No data found.  Updated Vital Signs BP 137/83 (BP Location: Right Arm)   Pulse 68   Temp (!) 97.5 F (36.4 C) (Oral)   Resp 18   Ht 5\' 6"  (1.676 m)   Wt 67.1 kg  SpO2 98%   BMI 23.89 kg/m   Visual Acuity Right Eye Distance:   Left Eye Distance:   Bilateral Distance:    Right Eye Near:   Left Eye Near:    Bilateral Near:     Physical Exam Vitals and nursing note reviewed.  Constitutional:      General: She is not in acute distress. HENT:     Head: Normocephalic.  Eyes:     Pupils: Pupils are equal, round, and reactive to light.  Cardiovascular:     Rate and Rhythm: Normal rate.  Pulmonary:     Effort: Pulmonary effort is normal.  Musculoskeletal:       Hands:     Comments: 1cm simple linear shallow laceration dorsum left hand as noted on diagram. Wound edges remain approximated without gaping.  All joints have full range of motion.  Distal neurovascular function is intact.     Skin:    General: Skin is warm and dry.  Neurological:     General: No focal deficit present.     Mental Status: She is alert.      UC Treatments / Results  Labs (all labs ordered are listed, but only abnormal results are displayed) Labs Reviewed - No data to display  EKG   Radiology No results found.  Procedures Procedures Laceration repair dorsum left hand (Sutures not indicated).  Using sterile technique, cleansed wound with Hibiclens solution. Wound carefully inspected for debris and foreign bodies; none found.  Wound edges carefully approximated in normal anatomic position and closed with three 1/8inch wide Steri-strips.  Surrounding skin coated with benzoin prior to applying strips.  Bandage applied.  Wound precautions explained to patient.     Medications Ordered in UC Medications - No data to display  Initial Impression / Assessment and Plan / UC Course  I have reviewed the triage  vital signs and the nursing notes.  Pertinent labs & imaging results that were available during my care of the patient were reviewed by me and considered in my medical decision making (see chart for details).    Begin empiric Augmentin 875 Q12hr for five days.   Final Clinical Impressions(s) / UC Diagnoses   Final diagnoses:  Dog bite of left hand, initial encounter     Discharge Instructions      Change dressing daily. Keep wound clean and dry.  Return for any signs of infection (or follow-up with family doctor):  Increasing redness, swelling, pain, heat, drainage, etc.        ED Prescriptions     Medication Sig Dispense Auth. Provider   amoxicillin-clavulanate (AUGMENTIN) 875-125 MG tablet Take one tab PO Q12hr PC 10 tablet Lattie Haw, MD         Lattie Haw, MD 12/09/22 [phone]

## 2022-12-08 NOTE — Discharge Instructions (Addendum)
Change dressing daily. Keep wound clean and dry.  Return for any signs of infection (or follow-up with family doctor):  Increasing redness, swelling, pain, heat, drainage, etc.

## 2023-01-08 ENCOUNTER — Ambulatory Visit (INDEPENDENT_AMBULATORY_CARE_PROVIDER_SITE_OTHER): Payer: Medicare (Managed Care) | Admitting: Family Medicine

## 2023-01-08 DIAGNOSIS — Z1231 Encounter for screening mammogram for malignant neoplasm of breast: Secondary | ICD-10-CM

## 2023-01-08 DIAGNOSIS — Z1211 Encounter for screening for malignant neoplasm of colon: Secondary | ICD-10-CM

## 2023-01-08 DIAGNOSIS — Z0000 Encounter for general adult medical examination without abnormal findings: Secondary | ICD-10-CM

## 2023-01-08 NOTE — Patient Instructions (Addendum)
MEDICARE ANNUAL WELLNESS VISIT Health Maintenance Summary and Written Plan of Care  [name] ,  Thank you for allowing me to perform your Medicare Annual Wellness Visit and for your ongoing commitment to your health.   Health Maintenance & Immunization History Health Maintenance  Topic Date Due   COVID-19 Vaccine (6 - 2023-24 season) 01/24/2023 (Originally 12/24/2022)   COLON CANCER SCREENING ANNUAL FOBT  07/08/2023 (Originally 07/21/2022)   INFLUENZA VACCINE  07/23/2023 (Originally 11/23/2022)   MAMMOGRAM  01/06/2024   Medicare Annual Wellness (AWV)  01/08/2024   DEXA SCAN  01/11/2025   DTaP/Tdap/Td (3 - Td or Tdap) 01/09/2030   Pneumonia Vaccine 44+ Years old  Completed   Hepatitis C Screening  Completed   Zoster Vaccines- Shingrix  Completed   HPV VACCINES  Aged Out   Colonoscopy  Discontinued   Immunization History  Administered Date(s) Administered   COVID-19, mRNA, vaccine(Comirnaty)12 years and older 03/03/2022   Fluad Quad(high Dose 65+) 03/03/2022   Influenza Inj Mdck Quad Pf 01/10/2019   Influenza, High Dose Seasonal PF 03/09/2017   Influenza,inj,Quad PF,6+ Mos 12/29/2014, 01/04/2016, 03/25/2018, 01/05/2021   Influenza-Unspecified 03/31/2014, 03/05/2020   PFIZER(Purple Top)SARS-COV-2 Vaccination 07/16/2019, 08/05/2019, 03/05/2020   PNEUMOCOCCAL CONJUGATE-20 09/16/2020   Pfizer Covid-19 Vaccine Bivalent Booster 17yrs & up 01/13/2021   Tdap 03/31/2014, 01/10/2020   Zoster Recombinant(Shingrix) 09/16/2020, 05/05/2021    These are the patient goals that we discussed:  Goals Addressed               This Visit's Progress     Patient Stated (pt-stated)        Patient stated that she would like to continue to be active.         This is a list of Health Maintenance Items that are overdue or due now: Influenza vaccine Colorectal cancer screening Mammogram     Orders/Referrals Placed Today: Orders Placed This Encounter  Procedures   Mammogram 3D SCREEN  BREAST BILATERAL    Standing Status:   Future    Standing Expiration Date:   01/08/2024    Scheduling Instructions:     Please call patient to schedule.    Order Specific Question:   Reason for Exam (SYMPTOM  OR DIAGNOSIS REQUIRED)    Answer:   breast cancer screening    Order Specific Question:   Preferred imaging location?    Answer:   [facility]   Cologuard    (Contact our referral department at [phone] if you have not spoken with someone about your referral appointment within the next 5 days)    Follow-up Plan Follow-up with Agapito Games, MD as planned Schedule influenza vaccine at the pharmacy.  Medicare wellness visit in one year.  Patient will access AVS on my chart.      Health Maintenance, Female Adopting a healthy lifestyle and getting preventive care are important in promoting health and wellness. Ask your health care provider about: The right schedule for you to have regular tests and exams. Things you can do on your own to prevent diseases and keep yourself healthy. What should I know about diet, weight, and exercise? Eat a healthy diet  Eat a diet that includes plenty of vegetables, fruits, low-fat dairy products, and lean protein. Do not eat a lot of foods that are high in solid fats, added sugars, or sodium. Maintain a healthy weight Body mass index (BMI) is used to identify weight problems. It estimates body fat based on height and weight. Your health care  provider can help determine your BMI and help you achieve or maintain a healthy weight. Get regular exercise Get regular exercise. This is one of the most important things you can do for your health. Most adults should: Exercise for at least 150 minutes each week. The exercise should increase your heart rate and make you sweat (moderate-intensity exercise). Do strengthening exercises at least twice a week. This is in addition to the moderate-intensity exercise. Spend less time  sitting. Even light physical activity can be beneficial. Watch cholesterol and blood lipids Have your blood tested for lipids and cholesterol at 68 years of age, then have this test every 5 years. Have your cholesterol levels checked more often if: Your lipid or cholesterol levels are high. You are older than 68 years of age. You are at high risk for heart disease. What should I know about cancer screening? Depending on your health history and family history, you may need to have cancer screening at various ages. This may include screening for: Breast cancer. Cervical cancer. Colorectal cancer. Skin cancer. Lung cancer. What should I know about heart disease, diabetes, and high blood pressure? Blood pressure and heart disease High blood pressure causes heart disease and increases the risk of stroke. This is more likely to develop in people who have high blood pressure readings or are overweight. Have your blood pressure checked: Every 3-5 years if you are 6-53 years of age. Every year if you are 40 years old or older. Diabetes Have regular diabetes screenings. This checks your fasting blood sugar level. Have the screening done: Once every three years after age 38 if you are at a normal weight and have a low risk for diabetes. More often and at a younger age if you are overweight or have a high risk for diabetes. What should I know about preventing infection? Hepatitis B If you have a higher risk for hepatitis B, you should be screened for this virus. Talk with your health care provider to find out if you are at risk for hepatitis B infection. Hepatitis C Testing is recommended for: Everyone born from [birth year] through 1966. Anyone with known risk factors for hepatitis C. Sexually transmitted infections (STIs) Get screened for STIs, including gonorrhea and chlamydia, if: You are sexually active and are younger than 68 years of age. You are older than 68 years of age and your health care  provider tells you that you are at risk for this type of infection. Your sexual activity has changed since you were last screened, and you are at increased risk for chlamydia or gonorrhea. Ask your health care provider if you are at risk. Ask your health care provider about whether you are at high risk for HIV. Your health care provider may recommend a prescription medicine to help prevent HIV infection. If you choose to take medicine to prevent HIV, you should first get tested for HIV. You should then be tested every 3 months for as long as you are taking the medicine. Pregnancy If you are about to stop having your period (premenopausal) and you may become pregnant, seek counseling before you get pregnant. Take 400 to 800 micrograms (mcg) of folic acid every day if you become pregnant. Ask for birth control (contraception) if you want to prevent pregnancy. Osteoporosis and menopause Osteoporosis is a disease in which the bones lose minerals and strength with aging. This can result in bone fractures. If you are 43 years old or older, or if you are at risk for  osteoporosis and fractures, ask your health care provider if you should: Be screened for bone loss. Take a calcium or vitamin D supplement to lower your risk of fractures. Be given hormone replacement therapy (HRT) to treat symptoms of menopause. Follow these instructions at home: Alcohol use Do not drink alcohol if: Your health care provider tells you not to drink. You are pregnant, may be pregnant, or are planning to become pregnant. If you drink alcohol: Limit how much you have to: 0-1 drink a day. Know how much alcohol is in your drink. In the [place] one drink equals one 12 oz bottle of beer (355 mL), one 5 oz glass of wine (148 mL), or one 1 oz glass of hard liquor (44 mL). Lifestyle Do not use any products that contain nicotine or tobacco. These products include cigarettes, chewing tobacco, and vaping devices, such as e-cigarettes.  If you need help quitting, ask your health care provider. Do not use street drugs. Do not share needles. Ask your health care provider for help if you need support or information about quitting drugs. General instructions Schedule regular health, dental, and eye exams. Stay current with your vaccines. Tell your health care provider if: You often feel depressed. You have ever been abused or do not feel safe at home. Summary Adopting a healthy lifestyle and getting preventive care are important in promoting health and wellness. Follow your health care provider's instructions about healthy diet, exercising, and getting tested or screened for diseases. Follow your health care provider's instructions on monitoring your cholesterol and blood pressure. This information is not intended to replace advice given to you by your health care provider. Make sure you discuss any questions you have with your health care provider. Document Revised: 08/30/2020 Document Reviewed: 08/30/2020 Elsevier Patient Education  2024 [employer].

## 2023-01-08 NOTE — Progress Notes (Signed)
MEDICARE ANNUAL WELLNESS VISIT  01/08/2023  Telephone Visit Disclaimer This Medicare AWV was conducted by telephone due to national recommendations for restrictions regarding the COVID-19 Pandemic (e.g. social distancing).  I verified, using two identifiers, that I am speaking with Desiree Hughes or their authorized healthcare agent. I discussed the limitations, risks, security, and privacy concerns of performing an evaluation and management service by telephone and the potential availability of an in-person appointment in the future. The patient expressed understanding and agreed to proceed.  Location of Patient: Home Location of Provider (nurse):  In the office.  Subjective:    Desiree Hughes is a 67 y.o. female patient of Metheney, Barbarann Ehlers, MD who had a Medicare Annual Wellness Visit today via telephone. Desiree Hughes is Retired and lives with their spouse and her oldest son. she has 2 children. she reports that she is socially active and does interact with friends/family regularly. she is moderately physically active and enjoys playing with her dogs and walking.  Patient Care Team: Agapito Games, MD as PCP - General (Family Medicine)     01/08/2023   10:26 AM 01/04/2022   11:05 AM 09/16/2020   12:31 PM 03/09/2017    3:15 PM 01/12/2016    8:54 AM 12/31/2014   10:24 AM 09/22/2014   11:08 AM  Advanced Directives  Does Patient Have a Medical Advance Directive? Yes Yes No No No No No  Type of Advance Directive Living will Living will       Does patient want to make changes to medical advance directive? No - Patient declined No - Patient declined       Would patient like information on creating a medical advance directive?   Yes (MAU/Ambulatory/Procedural Areas - Information given) Yes (MAU/Ambulatory/Procedural Areas - Information given) No - patient declined information Yes - Educational materials given Yes - Educational materials given    Hospital Utilization Over the Past 12  Months: # of hospitalizations or ER visits: 0 # of surgeries: 0  Review of Systems    Patient reports that her overall health is unchanged compared to last year.  History obtained from chart review and the patient  Patient Reported Readings (BP, Pulse, CBG, Weight, etc) none Per patient no change in vitals since last visit, unable to obtain new vitals due to telehealth visit  Pain Assessment Pain : No/denies pain     Current Medications & Allergies (verified) Allergies as of 01/08/2023       Reactions   Lisinopril Cough        Medication List        Accurate as of January 08, 2023 10:48 AM. If you have any questions, ask your nurse or doctor.          acetaminophen 650 MG CR tablet Commonly known as: TYLENOL Take 1 tablet (650 mg total) by mouth every 8 (eight) hours as needed for pain.   AMBULATORY NON FORMULARY MEDICATION Medication Name: Needs new headgear, mask, tubing for her CPAP machine.  Please order through [facility]   amoxicillin-clavulanate 875-125 MG tablet Commonly known as: AUGMENTIN Take one tab PO Q12hr PC   ascorbic acid 500 MG tablet Commonly known as: VITAMIN C Take 500 mg by mouth daily.   BIOTIN PO Take by mouth.   CALCIUM PO Take by mouth.   celecoxib 200 MG capsule Commonly known as: CeleBREX One to 2 tablets by mouth daily as needed for pain.   CENTRUM SILVER ULTRA WOMENS PO Take by mouth.  clonazePAM 1 MG tablet Commonly known as: KLONOPIN Take 1 tablet (1 mg total) by mouth daily as needed for anxiety.   cyclobenzaprine 10 MG tablet Commonly known as: FLEXERIL One half to one tab PO qHS, then increase gradually to one tab TID.   FISH OIL PO Take by mouth.   Flax Oil Take by mouth.   Fluocinolone Acetonide 0.01 % Oil Place 2 drops in ear(s) daily as needed (both ears).   fluticasone 50 MCG/ACT nasal spray Commonly known as: FLONASE USE 1 SPRAY IN EACH NOSTRIL DAILY   HYDROcodone-acetaminophen 5-325 MG  tablet Commonly known as: NORCO/VICODIN Take 1 tablet by mouth as needed for moderate pain.   MELATONIN PO Take by mouth.   MILK THISTLE PO Take by mouth.   nystatin-triamcinolone ointment Commonly known as: MYCOLOG Apply 1 application. topically 2 (two) times daily.   omeprazole 20 MG capsule Commonly known as: PRILOSEC TAKE 1 CAPSULE DAILY   SUPER B COMPLEX PO Take by mouth.   traZODone 50 MG tablet Commonly known as: DESYREL TAKE 3 TABLETS AT BEDTIME   VITAMIN D-3 PO Take by mouth.        History (reviewed): Past Medical History:  Diagnosis Date   Herniated disc, cervical    Hypertension    Psoriasis    Past Surgical History:  Procedure Laterality Date   BREAST CYST ASPIRATION Left    TUBAL LIGATION     Family History  Problem Relation Age of Onset   Diabetes Mother    Hyperlipidemia Mother    Hypertension Mother    Alcohol abuse Father    Cancer Father    Heart disease Father    Diabetes Father    Hyperlipidemia Father    Hypertension Father    Heart disease Brother    Asthma Brother    Alcohol abuse Brother    Early death Brother    Depression Son    Diabetes Son    Healthy Son    Social History   Socioeconomic History   Marital status: Married    Spouse name: [name]   Number of children: 2   Years of education: 13   Highest education level: 12th grade  Occupational History   Occupation: Retired.  Tobacco Use   Smoking status: Never   Smokeless tobacco: Never  Vaping Use   Vaping status: Never Used  Substance and Sexual Activity   Alcohol use: Yes    Comment: Occasionally   Drug use: No   Sexual activity: Not Currently  Other Topics Concern   Not on file  Social History Narrative   Lives with her husband and her oldest son. She enjoys playing her dogs.   Social Determinants of Health   Financial Resource Strain: Low Risk  (01/08/2023)   Overall Financial Resource Strain (CARDIA)    Difficulty of Paying Living Expenses:  Not hard at all  Food Insecurity: No Food Insecurity (01/08/2023)   Hunger Vital Sign    Worried About Running Out of Food in the Last Year: Never true    Ran Out of Food in the Last Year: Never true  Transportation Needs: No Transportation Needs (01/08/2023)   PRAPARE - Administrator, Civil Service (Medical): No    Lack of Transportation (Non-Medical): No  Physical Activity: Sufficiently Active (01/08/2023)   Exercise Vital Sign    Days of Exercise per Week: 6 days    Minutes of Exercise per Session: 40 min  Stress: No Stress Concern Present (  01/08/2023)   [employer] [employer] - Occupational Stress Questionnaire    Feeling of Stress : Not at all  Social Connections: Moderately Isolated (01/08/2023)   Social Connection and Isolation Panel [NHANES]    Frequency of Communication with Friends and Family: More than three times a week    Frequency of Social Gatherings with Friends and Family: More than three times a week    Attends Religious Services: Never    Database administrator or Organizations: No    Attends Banker Meetings: Never    Marital Status: Married    Activities of Daily Living    01/08/2023   10:31 AM  In your present state of health, do you have any difficulty performing the following activities:  Hearing? 0  Vision? 0  Difficulty concentrating or making decisions? 0  Walking or climbing stairs? 0  Dressing or bathing? 0  Doing errands, shopping? 0  Preparing Food and eating ? N  Using the Toilet? N  In the past six months, have you accidently leaked urine? N  Do you have problems with loss of bowel control? N  Managing your Medications? N  Managing your Finances? N  Housekeeping or managing your Housekeeping? N    Patient Education/ Literacy How often do you need to have someone help you when you read instructions, pamphlets, or other written materials from your doctor or pharmacy?: 1 - Never What is the last grade  level you completed in school?: some college  Exercise    Diet Patient reports consuming  2-3  meals a day and 2 snack(s) a day Patient reports that her primary diet is: Regular Patient reports that she does have regular access to food.   Depression Screen    01/08/2023   10:26 AM 03/03/2022    2:37 PM 01/04/2022   11:08 AM 12/21/2021   11:08 AM 07/14/2021    9:59 AM 09/16/2020   10:35 AM 11/04/2019   10:17 AM  PHQ 2/9 Scores  PHQ - 2 Score 0 2 0 0 2 2 4   PHQ- 9 Score  10   8 12 20      Fall Risk    01/08/2023   10:26 AM 03/03/2022    2:37 PM 01/04/2022   11:07 AM 12/21/2021   11:08 AM 07/14/2021    9:59 AM  Fall Risk   Falls in the past year? 1 0 1 1 0  Number falls in past yr: 0 0 0 0 0  Injury with Fall? 0 0 0 0 0  Risk for fall due to : No Fall Risks No Fall Risks History of fall(s) History of fall(s) No Fall Risks  Follow up Falls evaluation completed Falls evaluation completed Falls evaluation completed;Education provided;Falls prevention discussed Falls evaluation completed Falls prevention discussed     Objective:  Desiree Hughes seemed alert and oriented and she participated appropriately during our telephone visit.  Blood Pressure Weight BMI  BP Readings from Last 3 Encounters:  12/08/22 137/83  03/03/22 117/68  12/21/21 129/77   Wt Readings from Last 3 Encounters:  12/08/22 148 lb (67.1 kg)  03/03/22 153 lb (69.4 kg)  12/21/21 153 lb 1.3 oz (69.4 kg)   BMI Readings from Last 1 Encounters:  12/08/22 23.89 kg/m    *Unable to obtain current vital signs, weight, and BMI due to telephone visit type  Hearing/Vision  Desiree Hughes did not seem to have difficulty with hearing/understanding during the telephone conversation Reports that she has  had a formal eye exam by an eye care professional within the past year Reports that she has not had a formal hearing evaluation within the past year *Unable to fully assess hearing and vision during telephone visit type  Cognitive  Function:    01/08/2023   10:32 AM 01/04/2022   11:21 AM  6CIT Screen  What Year? 0 points 0 points  What month? 0 points 0 points  What time? 0 points 0 points  Count back from 20 0 points 0 points  Months in reverse 0 points 0 points  Repeat phrase 0 points 0 points  Total Score 0 points 0 points   (Normal:0-7, Significant for Dysfunction: >8)  Normal Cognitive Function Screening: Yes   Immunization & Health Maintenance Record Immunization History  Administered Date(s) Administered   COVID-19, mRNA, vaccine(Comirnaty)12 years and older 03/03/2022   Fluad Quad(high Dose 65+) 03/03/2022   Influenza Inj Mdck Quad Pf 01/10/2019   Influenza, High Dose Seasonal PF 03/09/2017   Influenza,inj,Quad PF,6+ Mos 12/29/2014, 01/04/2016, 03/25/2018, 01/05/2021   Influenza-Unspecified 03/31/2014, 03/05/2020   PFIZER(Purple Top)SARS-COV-2 Vaccination 07/16/2019, 08/05/2019, 03/05/2020   PNEUMOCOCCAL CONJUGATE-20 09/16/2020   Pfizer Covid-19 Vaccine Bivalent Booster 105yrs & up 01/13/2021   Tdap 03/31/2014, 01/10/2020   Zoster Recombinant(Shingrix) 09/16/2020, 05/05/2021    Health Maintenance  Topic Date Due   COVID-19 Vaccine (6 - 2023-24 season) 01/24/2023 (Originally 12/24/2022)   COLON CANCER SCREENING ANNUAL FOBT  07/08/2023 (Originally 07/21/2022)   INFLUENZA VACCINE  07/23/2023 (Originally 11/23/2022)   MAMMOGRAM  01/06/2024   Medicare Annual Wellness (AWV)  01/08/2024   DEXA SCAN  01/11/2025   DTaP/Tdap/Td (3 - Td or Tdap) 01/09/2030   Pneumonia Vaccine 6+ Years old  Completed   Hepatitis C Screening  Completed   Zoster Vaccines- Shingrix  Completed   HPV VACCINES  Aged Out   Colonoscopy  Discontinued       Assessment  This is a routine wellness examination for Desiree Hughes.  Health Maintenance: Due or Overdue There are no preventive care reminders to display for this patient.   Desiree Hughes does not need a referral for Community Assistance: Care Management:   no Social  Work:    no Prescription Assistance:  no Nutrition/Diabetes Education:  no   Plan:  Personalized Goals  Goals Addressed               This Visit's Progress     Patient Stated (pt-stated)        Patient stated that she would like to continue to be active.       Personalized Health Maintenance & Screening Recommendations  Influenza vaccine Colorectal cancer screening Mammogram   Lung Cancer Screening Recommended: no (Low Dose CT Chest recommended if Age 35-80 years, 20 pack-year currently smoking OR have quit w/in past 15 years) Hepatitis C Screening recommended: no HIV Screening recommended: no  Advanced Directives: Written information was not prepared per patient's request.  Referrals & Orders Orders Placed This Encounter  Procedures   Mammogram 3D SCREEN BREAST BILATERAL   Cologuard    Follow-up Plan Follow-up with Agapito Games, MD as planned Schedule influenza vaccine at the pharmacy.  Medicare wellness visit in one year.  Patient will access AVS on my chart.   I have personally reviewed and noted the following in the patient's chart:   Medical and social history Use of alcohol, tobacco or illicit drugs  Current medications and supplements Functional ability and status Nutritional status Physical activity Advanced directives List  of other physicians Hospitalizations, surgeries, and ER visits in previous 12 months Vitals Screenings to include cognitive, depression, and falls Referrals and appointments  In addition, I have reviewed and discussed with Desiree Hughes certain preventive protocols, quality metrics, and best practice recommendations. A written personalized care plan for preventive services as well as general preventive health recommendations is available and can be mailed to the patient at her request.      Modesto Charon, RN BSN  01/08/2023

## 2023-02-14 ENCOUNTER — Ambulatory Visit: Payer: Medicare (Managed Care)

## 2023-02-14 DIAGNOSIS — Z1231 Encounter for screening mammogram for malignant neoplasm of breast: Secondary | ICD-10-CM

## 2023-02-14 DIAGNOSIS — Z0000 Encounter for general adult medical examination without abnormal findings: Secondary | ICD-10-CM

## 2023-02-16 NOTE — Progress Notes (Signed)
Please call patient. Normal mammogram.  Repeat in 1 year.  

## 2023-02-19 ENCOUNTER — Other Ambulatory Visit (INDEPENDENT_AMBULATORY_CARE_PROVIDER_SITE_OTHER): Payer: Medicare (Managed Care)

## 2023-02-19 ENCOUNTER — Encounter: Payer: Self-pay | Admitting: Sports Medicine

## 2023-02-19 ENCOUNTER — Ambulatory Visit (INDEPENDENT_AMBULATORY_CARE_PROVIDER_SITE_OTHER): Payer: Medicare (Managed Care) | Admitting: Sports Medicine

## 2023-02-19 DIAGNOSIS — M160 Bilateral primary osteoarthritis of hip: Secondary | ICD-10-CM | POA: Diagnosis not present

## 2023-02-19 DIAGNOSIS — M65331 Trigger finger, right middle finger: Secondary | ICD-10-CM

## 2023-02-19 MED ORDER — HYDROCODONE-ACETAMINOPHEN 10-325 MG PO TABS: 0.5000 | ORAL_TABLET | Freq: Three times a day (TID) | ORAL | 0 refills | Status: DC | PRN

## 2023-02-19 MED ORDER — TRIAMCINOLONE ACETONIDE 40 MG/ML IJ SUSP
40.0000 mg | Freq: Once | INTRAMUSCULAR | Status: AC
Start: 2023-02-19 — End: 2023-02-19
  Administered 2023-02-19: 40 mg via INTRAMUSCULAR

## 2023-02-19 MED ORDER — DICLOFENAC SODIUM 1 % EX GEL: 2.0000 g | Freq: Four times a day (QID) | CUTANEOUS | 11 refills | Status: AC

## 2023-02-19 MED ORDER — DICLOFENAC SODIUM 1 % EX GEL: 2.0000 g | Freq: Four times a day (QID) | CUTANEOUS | Status: DC

## 2023-02-19 NOTE — Assessment & Plan Note (Signed)
Having some recurrence of pain right middle finger, she did well after injection back in May 2024, pain today is lesser so at the flexor tendon sheath and more at the third proximal to phalangeal joint with mild swelling. It is overall heading in the right direction so I would like her to do topical diclofenac gel 4 times daily before we consider intervention.

## 2023-02-19 NOTE — Progress Notes (Signed)
    Procedures performed today:    Procedure: Real-time Ultrasound Guided injection of the right hip joint Device: Samsung HS60  Verbal informed consent obtained.  Time-out conducted.  Noted no overlying erythema, induration, or other signs of local infection.  Skin prepped in a sterile fashion.  Local anesthesia: Topical Ethyl chloride.  With sterile technique and under real time ultrasound guidance: Arthritic joint noted, 1 cc Kenalog 40, 2 cc lidocaine, 2 cc bupivacaine injected easily Completed without difficulty  Advised to call if fevers/chills, erythema, induration, drainage, or persistent bleeding.  Images permanently stored and available for review in PACS.  Impression: Technically successful ultrasound guided injection.  Independent interpretation of notes and tests performed by another provider:   None.  Brief History, Exam, Impression, and Recommendations:    Primary osteoarthritis of both hips Recurrence of right hip osteoarthritis pain. Last injected in May 2024, repeat right hip joint injection today, return as needed.  Trigger finger, right middle finger Having some recurrence of pain right middle finger, she did well after injection back in May 2024, pain today is lesser so at the flexor tendon sheath and more at the third proximal to phalangeal joint with mild swelling. It is overall heading in the right direction so I would like her to do topical diclofenac gel 4 times daily before we consider intervention.    ____________________________________________ Ihor Austin. Benjamin Stain, M.D., ABFM., CAQSM., AME. Primary Care and Sports Medicine [place] [facility]  Adjunct Professor of Family Medicine  [place] [employer] [facility] [employer]  Restaurant manager, fast food

## 2023-02-19 NOTE — Addendum Note (Signed)
Addended by: Carren Rang A on: 02/19/2023 01:31 PM   Modules accepted: Orders

## 2023-02-19 NOTE — Assessment & Plan Note (Addendum)
Recurrence of right hip osteoarthritis pain. Last injected in May 2024, repeat right hip joint injection today, return as needed. Happy to send in an occasional hydrocodone, she takes about 40 pills over a years time.

## 2023-02-20 ENCOUNTER — Encounter: Payer: Self-pay | Admitting: Family Medicine

## 2023-02-20 ENCOUNTER — Ambulatory Visit (INDEPENDENT_AMBULATORY_CARE_PROVIDER_SITE_OTHER): Payer: Medicare (Managed Care) | Admitting: Family Medicine

## 2023-02-20 VITALS — BP 113/62 | HR 77 | Ht 66.0 in | Wt 152.0 lb

## 2023-02-20 DIAGNOSIS — J0100 Acute maxillary sinusitis, unspecified: Secondary | ICD-10-CM

## 2023-02-20 MED ORDER — AMOXICILLIN-POT CLAVULANATE 875-125 MG PO TABS: 1.0000 | ORAL_TABLET | Freq: Two times a day (BID) | ORAL | 0 refills | Status: DC

## 2023-02-20 MED ORDER — CYCLOBENZAPRINE HCL 10 MG PO TABS: ORAL_TABLET | ORAL | 0 refills | Status: DC

## 2023-02-20 NOTE — Progress Notes (Signed)
Acute Office Visit  Subjective:     Patient ID: Desiree Hughes, female    DOB: 11/21/1955, 67 y.o.   MRN: [id number]  Chief Complaint  Patient presents with   Sinusitis    X 2 weeks she states that she has some pain/pressure across her sinuses, in the mornings the drainage from her sinuses drains down the back of her throat and she can smell the odor and it is "rancid"    HPI Patient is in today for sinus sxs.  X 2 weeks she states that she has some pain/pressure across her sinuses, in the mornings the drainage from her sinuses drains down the back of her throat and she can smell the odor and it is "rancid".  She has had some intermittent pressure over her facial cheeks as well she has not had a lot of congestion just some mild intermittent congestion she has had a lot of thick drainage down the back of her throat.  But it is mostly the odor that she keeps smelling happened last year when she had a similar type of sinus infection but that time she had a lot more congestion with it.  ROS      Objective:    BP 113/62   Pulse 77   Ht 5\' 6"  (1.676 m)   Wt 152 lb (68.9 kg)   SpO2 100%   BMI 24.53 kg/m    Physical Exam Constitutional:      Appearance: Normal appearance.  HENT:     Head: Normocephalic and atraumatic.     Right Ear: Tympanic membrane, ear canal and external ear normal. There is no impacted cerumen.     Left Ear: Tympanic membrane, ear canal and external ear normal. There is no impacted cerumen.     Nose: Nose normal.     Mouth/Throat:     Pharynx: Oropharynx is clear.  Eyes:     Conjunctiva/sclera: Conjunctivae normal.  Cardiovascular:     Rate and Rhythm: Normal rate and regular rhythm.  Pulmonary:     Effort: Pulmonary effort is normal.     Breath sounds: Normal breath sounds.  Musculoskeletal:     Cervical back: Neck supple. No tenderness.  Lymphadenopathy:     Cervical: No cervical adenopathy.  Skin:    General: Skin is warm and dry.  Neurological:      Mental Status: She is alert and oriented to person, place, and time.  Psychiatric:        Mood and Affect: Mood normal.     No results found for any visits on 02/20/23.      Assessment & Plan:   Problem List Items Addressed This Visit   None Visit Diagnoses     Acute non-recurrent maxillary sinusitis    -  Primary   Relevant Medications   amoxicillin-clavulanate (AUGMENTIN) 875-125 MG tablet      Treat with Augmentin for 7 days.  If not improving then please let us know and consider referral to ENT or possibly sinus CT.  Meds ordered this encounter  Medications   cyclobenzaprine (FLEXERIL) 10 MG tablet    Sig: One half to one tab PO qHS, then increase gradually to one tab TID.    Dispense:  30 tablet    Refill:  0   amoxicillin-clavulanate (AUGMENTIN) 875-125 MG tablet    Sig: Take 1 tablet by mouth 2 (two) times daily.    Dispense:  14 tablet    Refill:  0  No follow-ups on file.  Nani Gasser, MD

## 2023-02-28 ENCOUNTER — Other Ambulatory Visit: Payer: Self-pay | Admitting: Family Medicine

## 2023-02-28 DIAGNOSIS — J301 Allergic rhinitis due to pollen: Secondary | ICD-10-CM

## 2023-03-12 DIAGNOSIS — Z1212 Encounter for screening for malignant neoplasm of rectum: Secondary | ICD-10-CM | POA: Diagnosis not present

## 2023-03-12 DIAGNOSIS — Z1211 Encounter for screening for malignant neoplasm of colon: Secondary | ICD-10-CM | POA: Diagnosis not present

## 2023-03-12 LAB — COLOGUARD: Cologuard: NEGATIVE

## 2023-03-20 LAB — COLOGUARD: COLOGUARD: NEGATIVE

## 2023-03-20 NOTE — Progress Notes (Signed)
Great news! Your Cologuard test is negative.  Recommend repeat colon cancer screening in 3 years.

## 2023-05-07 ENCOUNTER — Other Ambulatory Visit (INDEPENDENT_AMBULATORY_CARE_PROVIDER_SITE_OTHER): Payer: Medicare (Managed Care)

## 2023-05-07 ENCOUNTER — Other Ambulatory Visit: Payer: Self-pay

## 2023-05-07 ENCOUNTER — Ambulatory Visit (INDEPENDENT_AMBULATORY_CARE_PROVIDER_SITE_OTHER): Payer: Medicare (Managed Care) | Admitting: Sports Medicine

## 2023-05-07 DIAGNOSIS — G5603 Carpal tunnel syndrome, bilateral upper limbs: Secondary | ICD-10-CM | POA: Diagnosis not present

## 2023-05-07 DIAGNOSIS — M65331 Trigger finger, right middle finger: Secondary | ICD-10-CM

## 2023-05-07 DIAGNOSIS — M170 Bilateral primary osteoarthritis of knee: Secondary | ICD-10-CM | POA: Diagnosis not present

## 2023-05-07 MED ORDER — TRIAMCINOLONE ACETONIDE 40 MG/ML IJ SUSP
80.0000 mg | Freq: Once | INTRAMUSCULAR | Status: AC
  Administered 2023-05-07: 80 mg via INTRAMUSCULAR

## 2023-05-07 NOTE — Progress Notes (Signed)
    Procedures performed today:    Procedure: Real-time Ultrasound Guided injection of the right knee Device: Samsung HS60  Verbal informed consent obtained.  Time-out conducted.  Noted no overlying erythema, induration, or other signs of local infection.  Skin prepped in a sterile fashion.  Local anesthesia: Topical Ethyl chloride.  With sterile technique and under real time ultrasound guidance: Trace effusion noted, 1 cc Kenalog  40, 2 cc lidocaine, 2 cc bupivacaine injected easily Completed without difficulty  Advised to call if fevers/chills, erythema, induration, drainage, or persistent bleeding.  Images permanently stored and available for review in PACS.  Impression: Technically successful ultrasound guided injection.  Procedure: Real-time Ultrasound Guided injection of the right third flexor tendon sheath Device: Samsung HS60  Verbal informed consent obtained.  Time-out conducted.  Noted no overlying erythema, induration, or other signs of local infection.  Skin prepped in a sterile fashion.  Local anesthesia: Topical Ethyl chloride.  With sterile technique and under real time ultrasound guidance: Noted flexor tendon nodule, 0.5 cc lidocaine, 0.5 cc kenalog  40 injected easily Completed without difficulty  Advised to call if fevers/chills, erythema, induration, drainage, or persistent bleeding.  Images permanently stored and available for review in PACS.  Impression: Technically successful ultrasound guided injection.  Independent interpretation of notes and tests performed by another provider:   None.  Brief History, Exam, Impression, and Recommendations:    Osteoarthritis of patellofemoral joints, bilateral Desiree Hughes returns, she is having bilateral knee pain right worse than left patellofemoral in nature, unfortunately Celebrex  and aggressive home physical therapy are not effective, x-rays did confirm osteoarthritis, today we did a right knee joint injection, return to see  me 6 weeks as needed.  Trigger finger, right middle finger Desiree Hughes is having recurrence of triggering right middle finger, I did a flexor tendon sheath injection back in May, she did well until now, repeat right third flexor tendon sheath injection, return to see me as needed.  Bilateral carpal tunnel syndrome Desiree Hughes is not wearing her cock up splints. She has had a recurrence of carpal tunnel symptoms. We will get her to restart this and if insufficient improvement in 6 to 8 weeks we will consider median nerve hydrodissection's.    ____________________________________________ Desiree Hughes. Curtis, M.D., ABFM., CAQSM., AME. Primary Care and Sports Medicine  [facility]  Adjunct Professor of [facility]  [employer]  Restaurant Manager, [employer]

## 2023-05-07 NOTE — Assessment & Plan Note (Signed)
 Desiree Hughes is not wearing her cock up splints. She has had a recurrence of carpal tunnel symptoms. We will get her to restart this and if insufficient improvement in 6 to 8 weeks we will consider median nerve hydrodissection's.

## 2023-05-07 NOTE — Assessment & Plan Note (Signed)
 Desiree Hughes returns, she is having bilateral knee pain right worse than left patellofemoral in nature, unfortunately Celebrex  and aggressive home physical therapy are not effective, x-rays did confirm osteoarthritis, today we did a right knee joint injection, return to see me 6 weeks as needed.

## 2023-05-07 NOTE — Assessment & Plan Note (Signed)
 Desiree Hughes is having recurrence of triggering right middle finger, I did a flexor tendon sheath injection back in May, she did well until now, repeat right third flexor tendon sheath injection, return to see me as needed.

## 2023-05-07 NOTE — Addendum Note (Signed)
 Addended by: Carren Rang A on: 05/07/2023 04:30 PM   Modules accepted: Orders

## 2023-05-07 NOTE — Patient Instructions (Signed)
 Marland Kitchen

## 2023-05-07 NOTE — Code Documentation (Signed)
 done

## 2023-06-18 ENCOUNTER — Ambulatory Visit (INDEPENDENT_AMBULATORY_CARE_PROVIDER_SITE_OTHER): Payer: Medicare (Managed Care) | Admitting: Sports Medicine

## 2023-06-18 ENCOUNTER — Encounter: Payer: Self-pay | Admitting: Sports Medicine

## 2023-06-18 ENCOUNTER — Telehealth: Payer: Self-pay | Admitting: Sports Medicine

## 2023-06-18 DIAGNOSIS — G5603 Carpal tunnel syndrome, bilateral upper limbs: Secondary | ICD-10-CM | POA: Diagnosis not present

## 2023-06-18 DIAGNOSIS — M160 Bilateral primary osteoarthritis of hip: Secondary | ICD-10-CM | POA: Diagnosis not present

## 2023-06-18 DIAGNOSIS — G894 Chronic pain syndrome: Secondary | ICD-10-CM | POA: Diagnosis not present

## 2023-06-18 DIAGNOSIS — M170 Bilateral primary osteoarthritis of knee: Secondary | ICD-10-CM | POA: Diagnosis not present

## 2023-06-18 DIAGNOSIS — M65331 Trigger finger, right middle finger: Secondary | ICD-10-CM | POA: Diagnosis not present

## 2023-06-18 MED ORDER — CYCLOBENZAPRINE HCL 10 MG PO TABS: ORAL_TABLET | ORAL | 0 refills | Status: DC

## 2023-06-18 MED ORDER — HYDROCODONE-ACETAMINOPHEN 10-325 MG PO TABS: 0.5000 | ORAL_TABLET | Freq: Three times a day (TID) | ORAL | 0 refills | Status: DC | PRN

## 2023-06-18 NOTE — Assessment & Plan Note (Signed)
 Also with bilateral knee pain, right worse than left patellofemoral type pain, arthritis confirmed on x-rays, Celebrex, home PT were not effective, right knee steroid injection has not been effective, proceeding with viscosupplementation. She will call to schedule when approved.

## 2023-06-18 NOTE — Telephone Encounter (Signed)
 The request for any viscosupplement, bilateral x-ray confirmed knee osteoarthritis.  Failed everything including 6 weeks of physical therapy, oral NSAIDs, analgesics, activity modification, steroid injections.

## 2023-06-18 NOTE — Assessment & Plan Note (Signed)
 Bilateral carpal tunnel syndrome, she has been intermittently wearing her nocturnal cock up splints. Home PT has been done, persistent symptoms. She has had a few injections over the past couple months, as she has failed treatment of her stenosing tenosynovitis right third finger and we are getting a surgical consult I would like her to discuss trigger finger release and carpal tunnel release at the same time.

## 2023-06-18 NOTE — Progress Notes (Signed)
    Procedures performed today:    None.  Independent interpretation of notes and tests performed by another provider:   None.  Brief History, Exam, Impression, and Recommendations:    Bilateral carpal tunnel syndrome Bilateral carpal tunnel syndrome, she has been intermittently wearing her nocturnal cock up splints. Home PT has been done, persistent symptoms. She has had a few injections over the past couple months, as she has failed treatment of her stenosing tenosynovitis right third finger and we are getting a surgical consult I would like her to discuss trigger finger release and carpal tunnel release at the same time.  Osteoarthritis of patellofemoral joints, bilateral Also with bilateral knee pain, right worse than left patellofemoral type pain, arthritis confirmed on x-rays, Celebrex, home PT were not effective, right knee steroid injection has not been effective, proceeding with viscosupplementation. She will call to schedule when approved.  Trigger finger, right middle finger Has not responded to home physical therapy as well as an ultrasound-guided flexor tendon sheath injection in January. Referral to hand surgery.  Chronic pain syndrome Narissa does have a chronic pain syndrome so it will be difficult to control her pain regardless of what we do, we did refill her hydrocodone but this will be the last refill, ultimately if she needs continued refills of this we will refer her to pain management.    ____________________________________________ Ihor Austin. Benjamin Stain, M.D., ABFM., CAQSM., AME. Primary Care and Sports Medicine [place] [facility]  Adjunct Professor of Family Medicine  [place] [employer] [facility] [employer]  Restaurant manager, fast food

## 2023-06-18 NOTE — Assessment & Plan Note (Signed)
 Has not responded to home physical therapy as well as an ultrasound-guided flexor tendon sheath injection in January. Referral to hand surgery.

## 2023-06-18 NOTE — Assessment & Plan Note (Signed)
 Hawley does have a chronic pain syndrome so it will be difficult to control her pain regardless of what we do, we did refill her hydrocodone but this will be the last refill, ultimately if she needs continued refills of this we will refer her to pain management.

## 2023-06-19 ENCOUNTER — Other Ambulatory Visit: Payer: Self-pay | Admitting: Family Medicine

## 2023-06-19 DIAGNOSIS — M160 Bilateral primary osteoarthritis of hip: Secondary | ICD-10-CM

## 2023-06-19 NOTE — Telephone Encounter (Signed)
 Last Fill: Flexeril: 06/18/23     Hydrocodone: 06/18/23  Medications confirmed rcvd by pharmacy  No further action needed

## 2023-06-19 NOTE — Telephone Encounter (Signed)
 Copied from CRM [id number]. Topic: Clinical - Medication Refill >> Jun 19, 2023 11:45 AM Higinio Roger wrote: Most Recent Primary Care Visit:  Provider: Monica Becton  Department: [facility]  Visit Type: OFFICE VISIT  Date: 06/18/2023  Medication: cyclobenzaprine (FLEXERIL) 10 MG tablet  HYDROcodone-acetaminophen (NORCO) 10-325 MG tablet (CALL PHARMACY FOR FURTHER INFORMATION--DOSAGE NEEDS TO BE CLEARER)   Has the patient contacted their pharmacy? Yes (Agent: If no, request that the patient contact the pharmacy for the refill. If patient does not wish to contact the pharmacy document the reason why and proceed with request.) (Agent: If yes, when and what did the pharmacy advise?) Pharmacy needed prior authorization for clyclobenzaprine (FLEXERIL) and the dosage needs to be clearer for Hydrocodone  Is this the correct pharmacy for this prescription? Yes If no, delete pharmacy and type the correct one.  This is the patient's preferred pharmacy:   [id number] - [place], [place] - [address] AT [facility] [address] [place] [address] Phone: [phone] Fax: [phone]   Has the prescription been filled recently? No  Is the patient out of the medication? Yes  Has the patient been seen for an appointment in the last year OR does the patient have an upcoming appointment? Yes  Can we respond through MyChart? Yes  Agent: Please be advised that Rx refills may take up to 3 business days. We ask that you follow-up with your pharmacy.

## 2023-06-20 NOTE — Telephone Encounter (Signed)
 Duplicate request

## 2023-06-27 NOTE — Telephone Encounter (Addendum)
 Benefits Investigation Details received from [employer]: Orthovisc PA required: Yes May fill through: Buy and Bill  OV Copay/Coinsurance: $15 Product Copay: 20% Administration Coinsurance: 20% Administration Copay: $ Deductible: $0981(1.91 met) Prior Authorization is required for Orthovisc through [place]. completed provided form and faxed to [phone]  Benefits Investigation Details received from [employer]: Monovisc PA required: yes May fill through: Buy and Bill  OV Copay/Coinsurance: $15 Product Copay:20 % Administration Coinsurance: 20% Administration Copay:N/A Deductible:$4150(1.48 met)  Prior Authorization is required for [employer] through [place]. completed provided form and faxed to [phone]   Faxed additional information 07/02/23

## 2023-06-27 NOTE — Telephone Encounter (Incomplete Revision)
 Benefits Investigation Details received from [employer]: Orthovisc PA required: Yes May fill through: Buy and Bill  OV Copay/Coinsurance: $15 Product Copay: 20% Administration Coinsurance: 20% Administration Copay: $ Deductible: $0981(1.91 met) Prior Authorization is required for Orthovisc through [place]. completed provided form and faxed to [phone]  Benefits Investigation Details received from [employer]: Monovisc PA required: yes May fill through: Buy and Bill  OV Copay/Coinsurance: $15 Product Copay:20 % Administration Coinsurance: 20% Administration Copay:N/A Deductible:$4150(1.48 met)  Prior Authorization is required for [employer] through [place]. completed provided form and faxed to [phone]   Faxed additional information 07/02/23

## 2023-07-18 ENCOUNTER — Encounter: Payer: Self-pay | Admitting: Sports Medicine

## 2023-07-18 ENCOUNTER — Ambulatory Visit (INDEPENDENT_AMBULATORY_CARE_PROVIDER_SITE_OTHER): Payer: Medicare (Managed Care)

## 2023-07-18 ENCOUNTER — Ambulatory Visit: Payer: Medicare (Managed Care) | Admitting: Sports Medicine

## 2023-07-18 DIAGNOSIS — M170 Bilateral primary osteoarthritis of knee: Secondary | ICD-10-CM

## 2023-07-18 MED ORDER — TRAMADOL HCL 50 MG PO TABS: 50.0000 mg | ORAL_TABLET | Freq: Three times a day (TID) | ORAL | 0 refills | Status: DC | PRN

## 2023-07-18 MED ORDER — HYALURONAN 30 MG/2ML IX SOSY
30.0000 mg | PREFILLED_SYRINGE | Freq: Once | INTRA_ARTICULAR | Status: AC
  Administered 2023-07-18: 30 mg via INTRA_ARTICULAR

## 2023-07-18 NOTE — Addendum Note (Signed)
 Addended by: Monica Becton on: 07/18/2023 11:01 AM   Modules accepted: Orders

## 2023-07-18 NOTE — Addendum Note (Signed)
 Addended by: Samule Dry on: 07/18/2023 11:03 AM   Modules accepted: Orders

## 2023-07-18 NOTE — Assessment & Plan Note (Addendum)
 Bilateral patellofemoral arthritis, we did Orthovisc No. 1 of 4 both knees, return in 1 week for Orthovisc #2 of 4 both knees. She is having some discomfort today so we will add some tramadol

## 2023-07-18 NOTE — Progress Notes (Signed)
    Procedures performed today:    Procedure: Real-time Ultrasound Guided injection of the right knee Device: Samsung HS60  Verbal informed consent obtained.  Time-out conducted.  Noted no overlying erythema, induration, or other signs of local infection.  Skin prepped in a sterile fashion.  Local anesthesia: Topical Ethyl chloride.  With sterile technique and under real time ultrasound guidance: 30 mg/2 mL of OrthoVisc (sodium hyaluronate) in a prefilled syringe was injected easily into the knee through a 22-gauge needle. Completed without difficulty  Advised to call if fevers/chills, erythema, induration, drainage, or persistent bleeding.  Images permanently stored and available for review in PACS.  Impression: Technically successful ultrasound guided injection.  Procedure: Real-time Ultrasound Guided injection of the left knee Device: Samsung HS60  Verbal informed consent obtained.  Time-out conducted.  Noted no overlying erythema, induration, or other signs of local infection.  Skin prepped in a sterile fashion.  Local anesthesia: Topical Ethyl chloride.  With sterile technique and under real time ultrasound guidance: 30 mg/2 mL of OrthoVisc (sodium hyaluronate) in a prefilled syringe was injected easily into the knee through a 22-gauge needle. Completed without difficulty  Advised to call if fevers/chills, erythema, induration, drainage, or persistent bleeding.  Images permanently stored and available for review in PACS.  Impression: Technically successful ultrasound guided injection.  Independent interpretation of notes and tests performed by another provider:   None.  Brief History, Exam, Impression, and Recommendations:    Osteoarthritis of patellofemoral joints, bilateral Bilateral patellofemoral arthritis, we did Orthovisc No. 1 of 4 both knees, return in 1 week for Orthovisc #2 of 4 both knees.    ____________________________________________ Ihor Austin.  Benjamin Stain, M.D., ABFM., CAQSM., AME. Primary Care and Sports Medicine [place] [facility]  Adjunct Professor of Family Medicine  [place] [employer] [employer]  Restaurant manager, fast food

## 2023-07-21 DIAGNOSIS — G5601 Carpal tunnel syndrome, right upper limb: Secondary | ICD-10-CM | POA: Diagnosis not present

## 2023-07-25 ENCOUNTER — Other Ambulatory Visit (INDEPENDENT_AMBULATORY_CARE_PROVIDER_SITE_OTHER): Payer: Self-pay

## 2023-07-25 ENCOUNTER — Ambulatory Visit: Payer: Medicare (Managed Care) | Admitting: Sports Medicine

## 2023-07-25 ENCOUNTER — Encounter: Payer: Self-pay | Admitting: Sports Medicine

## 2023-07-25 DIAGNOSIS — M160 Bilateral primary osteoarthritis of hip: Secondary | ICD-10-CM

## 2023-07-25 DIAGNOSIS — M170 Bilateral primary osteoarthritis of knee: Secondary | ICD-10-CM

## 2023-07-25 MED ORDER — HYALURONAN 30 MG/2ML IX SOSY
30.0000 mg | PREFILLED_SYRINGE | Freq: Once | INTRA_ARTICULAR | Status: AC
  Administered 2023-07-25: 30 mg via INTRA_ARTICULAR

## 2023-07-25 NOTE — Progress Notes (Signed)
    Procedures performed today:    Procedure: Real-time Ultrasound Guided injection of the right knee Device: Samsung HS60  Verbal informed consent obtained.  Time-out conducted.  Noted no overlying erythema, induration, or other signs of local infection.  Skin prepped in a sterile fashion.  Local anesthesia: Topical Ethyl chloride.  With sterile technique and under real time ultrasound guidance: 30 mg/2 mL of OrthoVisc (sodium hyaluronate) in a prefilled syringe was injected easily into the knee through a 22-gauge needle. Completed without difficulty  Advised to call if fevers/chills, erythema, induration, drainage, or persistent bleeding.  Images permanently stored and available for review in PACS.  Impression: Technically successful ultrasound guided injection.   Procedure: Real-time Ultrasound Guided injection of the left knee Device: Samsung HS60  Verbal informed consent obtained.  Time-out conducted.  Noted no overlying erythema, induration, or other signs of local infection.  Skin prepped in a sterile fashion.  Local anesthesia: Topical Ethyl chloride.  With sterile technique and under real time ultrasound guidance: 30 mg/2 mL of OrthoVisc (sodium hyaluronate) in a prefilled syringe was injected easily into the knee through a 22-gauge needle. Completed without difficulty  Advised to call if fevers/chills, erythema, induration, drainage, or persistent bleeding.  Images permanently stored and available for review in PACS.  Impression: Technically successful ultrasound guided injection.  Independent interpretation of notes and tests performed by another provider:   None.  Brief History, Exam, Impression, and Recommendations:    Osteoarthritis of patellofemoral joints, bilateral Orthovisc No. 2 of 4 both knees, return in 1 week for #3 of 4. We did discuss neck and back pain today, we have added some home conditioning initially, after 6 weeks of home conditioning we will  consider imaging and intervention.    ____________________________________________ Ihor Austin. Benjamin Stain, M.D., ABFM., CAQSM., AME. Primary Care and Sports Medicine [place] [facility]  Adjunct Professor of Family Medicine  [place] [employer] [facility] [employer]  Restaurant manager, fast food

## 2023-07-25 NOTE — Assessment & Plan Note (Addendum)
 Orthovisc No. 2 of 4 both knees, return in 1 week for #3 of 4. We did discuss neck and back pain today, we have added some home conditioning initially, after 6 weeks of home conditioning we will consider imaging and intervention.

## 2023-07-25 NOTE — Addendum Note (Signed)
 Addended by: Samule Dry on: 07/25/2023 11:54 AM   Modules accepted: Orders

## 2023-08-01 ENCOUNTER — Other Ambulatory Visit (INDEPENDENT_AMBULATORY_CARE_PROVIDER_SITE_OTHER): Payer: Medicare (Managed Care)

## 2023-08-01 ENCOUNTER — Ambulatory Visit: Payer: Medicare (Managed Care) | Admitting: Sports Medicine

## 2023-08-01 ENCOUNTER — Encounter: Payer: Self-pay | Admitting: Sports Medicine

## 2023-08-01 DIAGNOSIS — M170 Bilateral primary osteoarthritis of knee: Secondary | ICD-10-CM

## 2023-08-01 MED ORDER — TRAMADOL HCL 50 MG PO TABS: 50.0000 mg | ORAL_TABLET | Freq: Three times a day (TID) | ORAL | 0 refills | Status: DC

## 2023-08-01 MED ORDER — HYALURONAN 30 MG/2ML IX SOSY
30.0000 mg | PREFILLED_SYRINGE | Freq: Once | INTRA_ARTICULAR | Status: AC
Start: 2023-08-01 — End: 2023-08-01
  Administered 2023-08-01: 30 mg via INTRA_ARTICULAR

## 2023-08-01 NOTE — Assessment & Plan Note (Signed)
Orthovisc 3 of 4 both knees, return in 1 week for #4 of 4 

## 2023-08-01 NOTE — Progress Notes (Signed)
    Procedures performed today:    Procedure: Real-time Ultrasound Guided injection of the right knee Device: Samsung HS60  Verbal informed consent obtained.  Time-out conducted.  Noted no overlying erythema, induration, or other signs of local infection.  Skin prepped in a sterile fashion.  Local anesthesia: Topical Ethyl chloride.  With sterile technique and under real time ultrasound guidance: 30 mg/2 mL of OrthoVisc (sodium hyaluronate) in a prefilled syringe was injected easily into the knee through a 22-gauge needle. Completed without difficulty  Advised to call if fevers/chills, erythema, induration, drainage, or persistent bleeding.  Images permanently stored and available for review in PACS.  Impression: Technically successful ultrasound guided injection.   Procedure: Real-time Ultrasound Guided injection of the left knee Device: Samsung HS60  Verbal informed consent obtained.  Time-out conducted.  Noted no overlying erythema, induration, or other signs of local infection.  Skin prepped in a sterile fashion.  Local anesthesia: Topical Ethyl chloride.  With sterile technique and under real time ultrasound guidance: 30 mg/2 mL of OrthoVisc (sodium hyaluronate) in a prefilled syringe was injected easily into the knee through a 22-gauge needle. Completed without difficulty  Advised to call if fevers/chills, erythema, induration, drainage, or persistent bleeding.  Images permanently stored and available for review in PACS.  Impression: Technically successful ultrasound guided injection.  Independent interpretation of notes and tests performed by another provider:   None.  Brief History, Exam, Impression, and Recommendations:    Osteoarthritis of patellofemoral joints, bilateral Orthovisc 3 of 4 both knees, return in 1 week for #4 of 4.    ____________________________________________ Ihor Austin. Benjamin Stain, M.D., ABFM., CAQSM., AME. Primary Care and Sports Medicine [facility]  Adjunct Professor of Family Medicine  [place] [employer]  Restaurant manager, fast food

## 2023-08-02 DIAGNOSIS — G5601 Carpal tunnel syndrome, right upper limb: Secondary | ICD-10-CM | POA: Diagnosis not present

## 2023-08-02 DIAGNOSIS — M65331 Trigger finger, right middle finger: Secondary | ICD-10-CM | POA: Diagnosis not present

## 2023-08-08 ENCOUNTER — Encounter: Payer: Self-pay | Admitting: Sports Medicine

## 2023-08-08 ENCOUNTER — Ambulatory Visit (INDEPENDENT_AMBULATORY_CARE_PROVIDER_SITE_OTHER): Payer: Medicare (Managed Care) | Admitting: Sports Medicine

## 2023-08-08 ENCOUNTER — Other Ambulatory Visit (INDEPENDENT_AMBULATORY_CARE_PROVIDER_SITE_OTHER): Payer: Medicare (Managed Care)

## 2023-08-08 DIAGNOSIS — M170 Bilateral primary osteoarthritis of knee: Secondary | ICD-10-CM | POA: Diagnosis not present

## 2023-08-08 DIAGNOSIS — M5136 Other intervertebral disc degeneration, lumbar region with discogenic back pain only: Secondary | ICD-10-CM

## 2023-08-08 MED ORDER — HYALURONAN 30 MG/2ML IX SOSY
30.0000 mg | PREFILLED_SYRINGE | Freq: Once | INTRA_ARTICULAR | Status: AC
  Administered 2023-08-08: 30 mg via INTRA_ARTICULAR

## 2023-08-08 NOTE — Progress Notes (Signed)
    Procedures performed today:    Procedure: Real-time Ultrasound Guided injection of the right knee Device: Samsung HS60  Verbal informed consent obtained.  Time-out conducted.  Noted no overlying erythema, induration, or other signs of local infection.  Skin prepped in a sterile fashion.  Local anesthesia: Topical Ethyl chloride.  With sterile technique and under real time ultrasound guidance: 30 mg/2 mL of OrthoVisc (sodium hyaluronate) in a prefilled syringe was injected easily into the knee through a 22-gauge needle. Completed without difficulty  Advised to call if fevers/chills, erythema, induration, drainage, or persistent bleeding.  Images permanently stored and available for review in PACS.  Impression: Technically successful ultrasound guided injection.   Procedure: Real-time Ultrasound Guided injection of the left knee Device: Samsung HS60  Verbal informed consent obtained.  Time-out conducted.  Noted no overlying erythema, induration, or other signs of local infection.  Skin prepped in a sterile fashion.  Local anesthesia: Topical Ethyl chloride.  With sterile technique and under real time ultrasound guidance: 30 mg/2 mL of OrthoVisc (sodium hyaluronate) in a prefilled syringe was injected easily into the knee through a 22-gauge needle. Completed without difficulty  Advised to call if fevers/chills, erythema, induration, drainage, or persistent bleeding.  Images permanently stored and available for review in PACS.  Impression: Technically successful ultrasound guided injection.  Independent interpretation of notes and tests performed by another provider:   None.  Brief History, Exam, Impression, and Recommendations:    Osteoarthritis of patellofemoral joints, bilateral Orthovisc 4 of 4 both knees, return to see me in 6 weeks as needed.  DDD (degenerative disc disease), lumbar Increasing axial back pain with radiation down the left leg, no red flag symptoms, adding  lumbar spine x-rays, home conditioning, return to see me in 6 weeks for this.    ____________________________________________ Joselyn Nicely. Sandy Crumb, M.D., ABFM., CAQSM., AME. Primary Care and Sports Medicine [facility]  Adjunct Professor of [facility] Medicine  [employer]  Restaurant manager, fast food

## 2023-08-08 NOTE — Assessment & Plan Note (Signed)
 Orthovisc 4 of 4 both knees, return to see me in 6 weeks as needed.

## 2023-08-08 NOTE — Assessment & Plan Note (Signed)
 Increasing axial back pain with radiation down the left leg, no red flag symptoms, adding lumbar spine x-rays, home conditioning, return to see me in 6 weeks for this.

## 2023-09-26 ENCOUNTER — Ambulatory Visit (INDEPENDENT_AMBULATORY_CARE_PROVIDER_SITE_OTHER): Payer: Medicare (Managed Care) | Admitting: Sports Medicine

## 2023-09-26 ENCOUNTER — Encounter: Payer: Self-pay | Admitting: Sports Medicine

## 2023-09-26 DIAGNOSIS — M170 Bilateral primary osteoarthritis of knee: Secondary | ICD-10-CM | POA: Diagnosis not present

## 2023-09-26 NOTE — Assessment & Plan Note (Signed)
 Desiree Hughes returns, she has bilateral patellofemoral osteoarthritis, she has had steroid injections, we finished a series of viscosupplementation in April. Left knee is doing okay, right knee still has significant achiness, stiffness and pain particularly anteriorly and laterally. She is having to take tramadol  3 times daily, celecoxib , acetaminophen . She is interested in decreasing the amount of pills that she needs to take so I think she is a good candidate for geniculate artery embolization, we will go and set her up with the referral.

## 2023-09-26 NOTE — Progress Notes (Signed)
    Procedures performed today:    None.  Independent interpretation of notes and tests performed by another provider:   None.  Brief History, Exam, Impression, and Recommendations:    Osteoarthritis of patellofemoral joints, bilateral Stephanine returns, she has bilateral patellofemoral osteoarthritis, she has had steroid injections, we finished a series of viscosupplementation in April. Left knee is doing okay, right knee still has significant achiness, stiffness and pain particularly anteriorly and laterally. She is having to take tramadol  3 times daily, celecoxib , acetaminophen . She is interested in decreasing the amount of pills that she needs to take so I think she is a good candidate for geniculate artery embolization, we will go and set her up with the referral.    ____________________________________________ Joselyn Nicely. Sandy Crumb, M.D., ABFM., CAQSM., AME. Primary Care and Sports Medicine [facility]  Adjunct Professor of [facility] Medicine  [employer]  Restaurant manager, fast food

## 2023-10-15 ENCOUNTER — Other Ambulatory Visit: Payer: Self-pay | Admitting: Family Medicine

## 2023-10-15 DIAGNOSIS — K219 Gastro-esophageal reflux disease without esophagitis: Secondary | ICD-10-CM

## 2023-10-22 NOTE — Progress Notes (Signed)
 Chief Complaint: Patient was seen in consultation today for bilateral knee pain.   Referring Physician(s): Thekkekandam,Thomas J  History of Present Illness: Desiree Hughes is a 68 y.o. female with a medical history significant for psoriasis, HTN, herniated cervical disc, bilateral carpal tunnel syndrome, right middle trigger finger, primary osteoarthritis of both hips and bilateral osteoarthritis of patellofemoral joints. She has been followed by Dr. Curtis with Sports Medicine since September 2023 and has had her knee pain treated conservatively since then. She has received numerous steroid injections and she finished a series of viscosupplementation in April. Her left knee is doing ok but her right knee aches significantly. She is taking tramadol , celecoxib  and acetaminophen  multiple times daily. At her last visit with Dr. Curtis 09/26/23 she expressed a desire to decrease the number of pills she is taking daily. He thinks she would be a good candidate for geniculate artery embolization.  Dr. Curtis has kindly referred the patient to Interventional Radiology for further discussion.   [place] Pain Score = 64/96 VAS Pain Score =    Past Medical History:  Diagnosis Date   Herniated disc, cervical    Hypertension    Psoriasis     Past Surgical History:  Procedure Laterality Date   BREAST CYST ASPIRATION Left    TUBAL LIGATION      Allergies: Lisinopril  Medications: Prior to Admission medications   Medication Sig Start Date End Date Taking? Authorizing Provider  acetaminophen  (TYLENOL ) 650 MG CR tablet Take 1 tablet (650 mg total) by mouth every 8 (eight) hours as needed for pain. 12/30/21   Curtis Debby PARAS, MD  AMBULATORY NON FORMULARY MEDICATION Medication Name: Needs new headgear, mask, tubing for her CPAP machine.  Please order through [facility] 09/18/17   Alvan Dorothyann BIRCH, MD  amoxicillin -clavulanate (AUGMENTIN ) 875-125 MG tablet Take 1 tablet by mouth 2  (two) times daily. 02/20/23   Alvan Dorothyann BIRCH, MD  B Complex-C (SUPER B COMPLEX PO) Take by mouth.    [provider]  BIOTIN PO Take by mouth.    [provider]  CALCIUM PO Take by mouth.    [provider]  celecoxib  (CELEBREX ) 200 MG capsule One to 2 tablets by mouth daily as needed for pain. 09/20/22   Curtis Debby PARAS, MD  Cholecalciferol (VITAMIN D-3 PO) Take by mouth.    [provider]  clonazePAM  (KLONOPIN ) 1 MG tablet Take 1 tablet (1 mg total) by mouth daily as needed for anxiety. 07/14/21   Alvan Dorothyann BIRCH, MD  cyclobenzaprine  (FLEXERIL ) 10 MG tablet One half to one tab PO qHS, then increase gradually to one tab TID. 06/18/23   Curtis Debby PARAS, MD  diclofenac  Sodium (VOLTAREN ) 1 % GEL Apply 2 g topically 4 (four) times daily. To affected joint. 02/19/23   Curtis Debby PARAS, MD  Flax OIL Take by mouth.    [provider]  Fluocinolone  Acetonide 0.01 % OIL Place 2 drops in ear(s) daily as needed (both ears). 03/03/22   Alvan Dorothyann BIRCH, MD  fluticasone  (FLONASE ) 50 MCG/ACT nasal spray USE 1 SPRAY IN EACH NOSTRIL DAILY 02/28/23   Metheney, Catherine D, MD  MELATONIN PO Take by mouth.    [provider]  MILK THISTLE PO Take by mouth.    [provider]  Multiple Vitamins-Minerals (CENTRUM SILVER ULTRA WOMENS PO) Take by mouth.    [provider]  nystatin -triamcinolone  ointment (MYCOLOG) Apply 1 application. topically 2 (two) times daily. 09/06/21   Alvan Dorothyann  D, MD  Omega-3 Fatty Acids (FISH OIL PO) Take by mouth.    [provider]  omeprazole  (PRILOSEC) 20 MG capsule TAKE 1 CAPSULE DAILY 10/15/23   Alvan Dorothyann BIRCH, MD  traMADol  (ULTRAM ) 50 MG tablet Take 1 tablet (50 mg total) by mouth in the morning, at noon, and at bedtime. 08/01/23   Curtis Debby PARAS, MD  traZODone  (DESYREL ) 50 MG tablet TAKE 3 TABLETS AT BEDTIME 10/19/22   Alvan Dorothyann BIRCH, MD   vitamin C (ASCORBIC ACID) 500 MG tablet Take 500 mg by mouth daily.    [provider]     Family History  Problem Relation Age of Onset   Diabetes Mother    Hyperlipidemia Mother    Hypertension Mother    Alcohol abuse Father    Cancer Father    Heart disease Father    Diabetes Father    Hyperlipidemia Father    Hypertension Father    Heart disease Brother    Asthma Brother    Alcohol abuse Brother    Early death Brother    Depression Son    Diabetes Son    Healthy Son     Social History   Socioeconomic History   Marital status: Married    Spouse name: elsie   Number of children: 2   Years of education: 13   Highest education level: 12th grade  Occupational History   Occupation: Retired.  Tobacco Use   Smoking status: Never   Smokeless tobacco: Never  Vaping Use   Vaping status: Never Used  Substance and Sexual Activity   Alcohol use: Yes    Comment: Occasionally   Drug use: No   Sexual activity: Not Currently  Other Topics Concern   Not on file  Social History Narrative   Lives with her husband and her oldest son. She enjoys playing her dogs.   Social Drivers of Corporate investment banker Strain: Low Risk  (01/08/2023)   Overall Financial Resource Strain (CARDIA)    Difficulty of Paying Living Expenses: Not hard at all  Food Insecurity: No Food Insecurity (01/08/2023)   Hunger Vital Sign    Worried About Running Out of Food in the Last Year: Never true    Ran Out of Food in the Last Year: Never true  Transportation Needs: No Transportation Needs (01/08/2023)   PRAPARE - Administrator, Civil Service (Medical): No    Lack of Transportation (Non-Medical): No  Physical Activity: Sufficiently Active (01/08/2023)   Exercise Vital Sign    Days of Exercise per Week: 6 days    Minutes of Exercise per Session: 40 min  Stress: No Stress Concern Present (01/08/2023)   [employer] of Occupational Health - Occupational Stress  Questionnaire    Feeling of Stress : Not at all  Social Connections: Moderately Isolated (01/08/2023)   Social Connection and Isolation Panel    Frequency of Communication with Friends and Family: More than three times a week    Frequency of Social Gatherings with Friends and Family: More than three times a week    Attends Religious Services: Never    Database administrator or Organizations: No    Attends Banker Meetings: Never    Marital Status: Married    Review of Systems: A 12 point ROS discussed and pertinent positives are indicated in the HPI above.  All other systems are negative.  Review of Systems  Vital Signs: There were no vitals taken  for this visit.  Advance Care Plan: The advanced care plan/surrogate decision maker was discussed at the time of visit and documented in the medical record.    Physical Exam  Imaging:  Bilateral knees 12/30/21    Labs:  CBC: No results for input(s): WBC, HGB, HCT, PLT in the last 8760 hours.  COAGS: No results for input(s): INR, APTT in the last 8760 hours.  BMP: No results for input(s): NA, K, CL, CO2, GLUCOSE, BUN, CALCIUM, CREATININE, GFRNONAA, GFRAA in the last 8760 hours.  Invalid input(s): CMP  LIVER FUNCTION TESTS: No results for input(s): BILITOT, AST, ALT, ALKPHOS, PROT, ALBUMIN in the last 8760 hours.  TUMOR MARKERS: No results for input(s): AFPTM, CEA, CA199, CHROMGRNA in the last 8760 hours.  Assessment and Plan:  68 year old female with a history of bilateral knee pain secondary to patellofemoral osteoarthritis.   Thank you for this interesting consult.  I greatly enjoyed meeting Desiree Hughes and look forward to participating in their care.  A copy of this report was sent to the requesting provider on this date.  Ester Sides, MD Pager: [phone]    I spent a total of  40 Minutes   in face to face in clinical consultation, greater than  50% of which was counseling/coordinating care for bilateral knee pain.

## 2023-10-24 ENCOUNTER — Ambulatory Visit
Admission: RE | Admit: 2023-10-24 | Discharge: 2023-10-24 | Disposition: A | Discharge status: Home / Self Care | Payer: Medicare (Managed Care) | Source: Ambulatory Visit | Attending: Sports Medicine | Admitting: Sports Medicine

## 2023-10-24 DIAGNOSIS — M25562 Pain in left knee: Secondary | ICD-10-CM | POA: Diagnosis not present

## 2023-10-24 DIAGNOSIS — M25561 Pain in right knee: Secondary | ICD-10-CM | POA: Diagnosis not present

## 2023-10-24 DIAGNOSIS — M170 Bilateral primary osteoarthritis of knee: Secondary | ICD-10-CM | POA: Diagnosis not present

## 2023-10-24 HISTORY — PX: IR RADIOLOGIST EVAL & MGMT: IMG5224

## 2023-11-12 ENCOUNTER — Other Ambulatory Visit: Payer: Self-pay | Admitting: Sports Medicine

## 2023-11-12 DIAGNOSIS — M170 Bilateral primary osteoarthritis of knee: Secondary | ICD-10-CM

## 2023-12-07 ENCOUNTER — Other Ambulatory Visit: Payer: Self-pay | Admitting: Sports Medicine

## 2023-12-07 DIAGNOSIS — M170 Bilateral primary osteoarthritis of knee: Secondary | ICD-10-CM

## 2023-12-25 ENCOUNTER — Encounter: Payer: Self-pay | Admitting: Sports Medicine

## 2024-01-31 ENCOUNTER — Ambulatory Visit: Payer: Medicare (Managed Care)

## 2024-01-31 VITALS — Ht 65.75 in | Wt 156.2 lb

## 2024-01-31 DIAGNOSIS — Z0000 Encounter for general adult medical examination without abnormal findings: Secondary | ICD-10-CM

## 2024-01-31 NOTE — Progress Notes (Signed)
 Subjective:   Desiree Hughes is a 68 y.o. female who presents for an Initial Medicare Annual Wellness Visit.  Visit Complete: Virtual I connected with  Desiree Hughes on 01/31/24 by a audio enabled telemedicine application and verified that I am speaking with the correct person using two identifiers.  Patient Location: Home  Provider Location: Office/Clinic  I discussed the limitations of evaluation and management by telemedicine. The patient expressed understanding and agreed to proceed.  Vital Signs: Because this visit was a virtual/telehealth visit, some criteria may be missing or patient reported. Any vitals not documented were not able to be obtained and vitals that have been documented are patient reported.  Patient Medicare AWV questionnaire was completed by the patient on n/a; I have confirmed that all information answered by patient is correct and no changes since this date.  Cardiac Risk Factors include: advanced age (>73men, >5 women);hypertension;family history of premature cardiovascular disease     Objective:    Today's Vitals   01/31/24 0902  Weight: 156 lb 3.2 oz (70.9 kg)  Height: 5' 5.75 (1.67 m)   Body mass index is 25.4 kg/m.     01/31/2024    9:08 AM 01/08/2023   10:26 AM 01/04/2022   11:05 AM 09/16/2020   12:31 PM 03/09/2017    3:15 PM 01/12/2016    8:54 AM 12/31/2014   10:24 AM  Advanced Directives  Does Patient Have a Medical Advance Directive? Yes Yes Yes No No  No  No   Type of Estate agent of [place];Living will Living will Living will      Does patient want to make changes to medical advance directive? No - Patient declined No - Patient declined No - Patient declined      Would patient like information on creating a medical advance directive?    Yes (MAU/Ambulatory/Procedural Areas - Information given) Yes (MAU/Ambulatory/Procedural Areas - Information given)  No - patient declined information  Yes - Educational materials given       Data saved with a previous flowsheet row definition     Current Medications (verified) Outpatient Encounter Medications as of 01/31/2024  Medication Sig   AMBULATORY NON FORMULARY MEDICATION Medication Name: Needs new headgear, mask, tubing for her CPAP machine.  Please order through [facility]   B Complex-C (SUPER B COMPLEX PO) Take by mouth.   BIOTIN PO Take by mouth.   CALCIUM PO Take by mouth.   celecoxib  (CELEBREX ) 200 MG capsule TAKE 1 TO 2 CAPSULES DAILY AS NEEDED FOR PAIN   Cholecalciferol (VITAMIN D-3 PO) Take by mouth.   clonazePAM  (KLONOPIN ) 1 MG tablet Take 1 tablet (1 mg total) by mouth daily as needed for anxiety.   cyclobenzaprine  (FLEXERIL ) 10 MG tablet One half to one tab PO qHS, then increase gradually to one tab TID.   diclofenac  Sodium (VOLTAREN ) 1 % GEL Apply 2 g topically 4 (four) times daily. To affected joint.   Flax OIL Take by mouth.   Fluocinolone  Acetonide 0.01 % OIL Place 2 drops in ear(s) daily as needed (both ears).   fluticasone  (FLONASE ) 50 MCG/ACT nasal spray USE 1 SPRAY IN EACH NOSTRIL DAILY   MELATONIN PO Take by mouth.   MILK THISTLE PO Take by mouth.   Multiple Vitamins-Minerals (CENTRUM SILVER ULTRA WOMENS PO) Take by mouth.   nystatin -triamcinolone  ointment (MYCOLOG) Apply 1 application. topically 2 (two) times daily.   Omega-3 Fatty Acids (FISH OIL PO) Take by mouth.   omeprazole  (PRILOSEC) 20 MG  capsule TAKE 1 CAPSULE DAILY   traMADol  (ULTRAM ) 50 MG tablet Take 1 tablet (50 mg total) by mouth 3 (three) times daily as needed.   traZODone  (DESYREL ) 50 MG tablet TAKE 3 TABLETS AT BEDTIME   vitamin C (ASCORBIC ACID) 500 MG tablet Take 500 mg by mouth daily.   acetaminophen  (TYLENOL ) 650 MG CR tablet Take 1 tablet (650 mg total) by mouth every 8 (eight) hours as needed for pain. (Patient not taking: Reported on 01/31/2024)   [DISCONTINUED] amoxicillin -clavulanate (AUGMENTIN ) 875-125 MG tablet Take 1 tablet by mouth 2 (two) times daily.   No  facility-administered encounter medications on file as of 01/31/2024.    Allergies (verified) Lisinopril   History: Past Medical History:  Diagnosis Date   Herniated disc, cervical    Hypertension    Psoriasis    Past Surgical History:  Procedure Laterality Date   BREAST CYST ASPIRATION Left    IR RADIOLOGIST EVAL & MGMT  10/24/2023   TUBAL LIGATION     Family History  Problem Relation Age of Onset   Diabetes Mother    Hyperlipidemia Mother    Hypertension Mother    Alcohol abuse Father    Cancer Father    Heart disease Father    Diabetes Father    Hyperlipidemia Father    Hypertension Father    Heart disease Brother    Asthma Brother    Alcohol abuse Brother    Early death Brother    Depression Son    Diabetes Son    Healthy Son    Social History   Socioeconomic History   Marital status: Married    Spouse name: elsie   Number of children: 2   Years of education: 13   Highest education level: 12th grade  Occupational History   Occupation: Retired.  Tobacco Use   Smoking status: Never   Smokeless tobacco: Never  Vaping Use   Vaping status: Never Used  Substance and Sexual Activity   Alcohol use: Yes    Comment: Occasionally   Drug use: No   Sexual activity: Not Currently  Other Topics Concern   Not on file  Social History Narrative   Lives with her husband and her oldest son. She enjoys playing her dogs.   Social Drivers of Corporate investment banker Strain: Low Risk  (01/08/2023)   Overall Financial Resource Strain (CARDIA)    Difficulty of Paying Living Expenses: Not hard at all  Food Insecurity: No Food Insecurity (01/08/2023)   Hunger Vital Sign    Worried About Running Out of Food in the Last Year: Never true    Ran Out of Food in the Last Year: Never true  Transportation Needs: No Transportation Needs (01/08/2023)   PRAPARE - Administrator, Civil Service (Medical): No    Lack of Transportation (Non-Medical): No  Physical  Activity: Sufficiently Active (01/08/2023)   Exercise Vital Sign    Days of Exercise per Week: 6 days    Minutes of Exercise per Session: 40 min  Stress: No Stress Concern Present (01/08/2023)   [employer] of Occupational Health - Occupational Stress Questionnaire    Feeling of Stress : Not at all  Social Connections: Moderately Isolated (01/08/2023)   Social Connection and Isolation Panel    Frequency of Communication with Friends and Family: More than three times a week    Frequency of Social Gatherings with Friends and Family: More than three times a week    Attends  Religious Services: Never    Active Member of Clubs or Organizations: No    Attends Banker Meetings: Never    Marital Status: Married    Tobacco Counseling Counseling given: Not Answered   Clinical Intake:  Pre-visit preparation completed: Yes  Pain : No/denies pain     BMI - recorded: 25.4 Nutritional Status: BMI 25 -29 Overweight Nutritional Risks: None Diabetes: No  How often do you need to have someone help you when you read instructions, pamphlets, or other written materials from your doctor or pharmacy?: 1 - Never What is the last grade level you completed in school?: 13  Interpreter Needed?: No      Activities of Daily Living    01/31/2024    9:03 AM  In your present state of health, do you have any difficulty performing the following activities:  Hearing? 0  Vision? 0  Difficulty concentrating or making decisions? 0  Walking or climbing stairs? 0  Dressing or bathing? 0  Doing errands, shopping? 0  Preparing Food and eating ? N  Using the Toilet? N  In the past six months, have you accidently leaked urine? N  Do you have problems with loss of bowel control? N  Managing your Medications? N  Managing your Finances? N  Housekeeping or managing your Housekeeping? N    Patient Care Team: Alvan Dorothyann BIRCH, MD as PCP - General (Family Medicine)  Indicate any recent  Medical Services you may have received from other than [facility] providers in the past year (date may be approximate).     Assessment:   This is a routine wellness examination for [place].  Hearing/Vision screen No results found.   Goals Addressed             This Visit's Progress    Patient Stated       Patient states she would like to walk more and lose weight.        Depression Screen    01/08/2023   10:26 AM 03/03/2022    2:37 PM 01/04/2022   11:08 AM 12/21/2021   11:08 AM 07/14/2021    9:59 AM 09/16/2020   10:35 AM 11/04/2019   10:17 AM  PHQ 2/9 Scores  PHQ - 2 Score 0 2 0 0 2 2 4   PHQ- 9 Score  10   8 12 20     Fall Risk    01/31/2024    9:09 AM 01/08/2023   10:26 AM 03/03/2022    2:37 PM 01/04/2022   11:07 AM 12/21/2021   11:08 AM  Fall Risk   Falls in the past year? 0 1 0 1 1  Number falls in past yr: 0 0 0 0 0  Injury with Fall? 0 0 0 0 0  Risk for fall due to : No Fall Risks No Fall Risks No Fall Risks History of fall(s) History of fall(s)  Follow up Falls evaluation completed Falls evaluation completed Falls evaluation completed  Falls evaluation completed;Education provided;Falls prevention discussed  Falls evaluation completed      Data saved with a previous flowsheet row definition     MEDICARE RISK AT HOME: Medicare Risk at Home Any stairs in or around the home?: Yes If so, are there any without handrails?: Yes Home free of loose throw rugs in walkways, pet beds, electrical cords, etc?: Yes Adequate lighting in your home to reduce risk of falls?: Yes Life alert?: No Use of a cane, walker or w/c?: No Grab bars  in the bathroom?: Yes Shower chair or bench in shower?: No Elevated toilet seat or a handicapped toilet?: Yes  TIMED UP AND GO:  Was the test performed? No    Cognitive Function:        01/31/2024    9:10 AM 01/08/2023   10:32 AM 01/04/2022   11:21 AM  6CIT Screen  What Year? 0 points 0 points 0 points  What month? 0 points 0 points 0  points  What time? 0 points 0 points 0 points  Count back from 20 0 points 0 points 0 points  Months in reverse 0 points 0 points 0 points  Repeat phrase 0 points 0 points 0 points  Total Score 0 points 0 points 0 points    Immunizations Immunization History  Administered Date(s) Administered   Fluad Quad(high Dose 65+) 03/03/2022, 01/10/2024   INFLUENZA, HIGH DOSE SEASONAL PF 03/09/2017   Influenza Inj Mdck Quad Pf 01/10/2019   Influenza,inj,Quad PF,6+ Mos 12/29/2014, 01/04/2016, 03/25/2018, 01/05/2021   Influenza-Unspecified 03/31/2014, 03/05/2020   PFIZER(Purple Top)SARS-COV-2 Vaccination 07/16/2019, 08/05/2019, 03/05/2020   PNEUMOCOCCAL CONJUGATE-20 09/16/2020   Pfizer Covid-19 Vaccine Bivalent Booster 54yrs & up 01/13/2021   Pfizer(Comirnaty)Fall Seasonal Vaccine 12 years and older 03/03/2022, 01/10/2024   Tdap 03/31/2014, 01/10/2020   Zoster Recombinant(Shingrix) 09/16/2020, 05/05/2021    TDAP status: Up to date  Flu Vaccine status: Up to date  Pneumococcal vaccine status: Up to date  Covid-19 vaccine status: Completed vaccines  Qualifies for Shingles Vaccine? Yes   Zostavax completed No   Shingrix Completed?: Yes  Screening Tests Health Maintenance  Topic Date Due   COLON CANCER SCREENING ANNUAL FOBT  07/21/2022   COVID-19 Vaccine (7 - 2025-26 season) 07/09/2024   DEXA SCAN  01/11/2025   Medicare Annual Wellness (AWV)  01/30/2025   Mammogram  02/13/2025   DTaP/Tdap/Td (3 - Td or Tdap) 01/09/2030   Pneumococcal Vaccine: 50+ Years  Completed   Influenza Vaccine  Completed   Hepatitis C Screening  Completed   Zoster Vaccines- Shingrix  Completed   Meningococcal B Vaccine  Aged Out   Colonoscopy  Discontinued    Health Maintenance  Health Maintenance Due  Topic Date Due   COLON CANCER SCREENING ANNUAL FOBT  07/21/2022    Colorectal cancer screening: Type of screening: Cologuard. Completed 03/12/2023. Repeat every 3 years  Mammogram status: Completed  02/14/2023. Repeat every year  Bone Density status: Completed 01/11/2022. Results reflect: Bone density results: OSTEOPENIA. Repeat every 3 years.  Lung Cancer Screening: (Low Dose CT Chest recommended if Age 28-80 years, 20 pack-year currently smoking OR have quit w/in 15years.) does not qualify.   Lung Cancer Screening Referral: n/a  Additional Screening:  Hepatitis C Screening: does qualify; Completed 08/04/2013  Vision Screening: Recommended annual ophthalmology exams for early detection of glaucoma and other disorders of the eye. Is the patient up to date with their annual eye exam?  Yes  Who is the provider or what is the name of the office in which the patient attends annual eye exams?  If pt is not established with a provider, would they like to be referred to a provider to establish care? N/a.   Dental Screening: Recommended annual dental exams for proper oral hygiene   Community Resource Referral / Chronic Care Management: CRR required this visit?  No   CCM required this visit?  No     Plan:     I have personally reviewed and noted the following in the patient's chart:  Medical and social history Use of alcohol, tobacco or illicit drugs  Current medications and supplements including opioid prescriptions. Patient is not currently taking opioid prescriptions. Functional ability and status Nutritional status Physical activity Advanced directives List of other physicians Hospitalizations, surgeries, and ER visits in previous 12 months. None Vitals Screenings to include cognitive, depression, and falls Referrals and appointments  In addition, I have reviewed and discussed with patient certain preventive protocols, quality metrics, and best practice recommendations. A written personalized care plan for preventive services as well as general preventive health recommendations were provided to patient.     Bonny Jon Mayor, CMA   01/31/2024   After Visit Summary:  (MyChart) Due to this being a telephonic visit, the after visit summary with patients personalized plan was offered to patient via MyChart   Nurse Notes:   Clary Boulais is a 68 y.o. female patient of Metheney, Dorothyann BIRCH, MD who had a Medicare Annual Wellness Visit today via telephone. Kyia is Retired and lives with their spouse and her oldest son. She has 2 children. She reports that she is socially active and does interact with friends/family regularly. She is moderately physically active and enjoys playing with her dogs and walking.

## 2024-01-31 NOTE — Patient Instructions (Signed)
  Ms. Fornes , Thank you for taking time to come for your Medicare Wellness Visit. I appreciate your ongoing commitment to your health goals. Please review the following plan we discussed and let me know if I can assist you in the future.   These are the goals we discussed:  Goals       Patient Stated      Would like to loose 10 lbs.      Patient Stated (pt-stated)      Patient stated that she would like to continue to be active.      Patient Stated      Patient states she would like to walk more and lose weight.         This is a list of the screening recommended for you and due dates:  Health Maintenance  Topic Date Due   Stool Blood Test  07/21/2022   COVID-19 Vaccine (7 - 2025-26 season) 07/09/2024   DEXA scan (bone density measurement)  01/11/2025   Medicare Annual Wellness Visit  01/30/2025   Breast Cancer Screening  02/13/2025   Cologuard (Stool DNA test)  03/11/2026   DTaP/Tdap/Td vaccine (3 - Td or Tdap) 01/09/2030   Pneumococcal Vaccine for age over 28  Completed   Flu Shot  Completed   Hepatitis C Screening  Completed   Zoster (Shingles) Vaccine  Completed   Meningitis B Vaccine  Aged Out

## 2024-02-04 ENCOUNTER — Encounter: Payer: Self-pay | Admitting: Urgent Care

## 2024-02-04 ENCOUNTER — Ambulatory Visit: Payer: Medicare (Managed Care) | Admitting: Urgent Care

## 2024-02-04 VITALS — BP 121/83 | HR 70 | Temp 97.9°F | Ht 66.0 in | Wt 165.0 lb

## 2024-02-04 DIAGNOSIS — H60543 Acute eczematoid otitis externa, bilateral: Secondary | ICD-10-CM

## 2024-02-04 DIAGNOSIS — L989 Disorder of the skin and subcutaneous tissue, unspecified: Secondary | ICD-10-CM

## 2024-02-04 DIAGNOSIS — J0101 Acute recurrent maxillary sinusitis: Secondary | ICD-10-CM

## 2024-02-04 MED ORDER — AMOXICILLIN-POT CLAVULANATE 875-125 MG PO TABS: 1.0000 | ORAL_TABLET | Freq: Two times a day (BID) | ORAL | 0 refills | Status: DC

## 2024-02-04 MED ORDER — PREDNISONE 20 MG PO TABS: 20.0000 mg | ORAL_TABLET | Freq: Every day | ORAL | 0 refills | Status: DC

## 2024-02-04 MED ORDER — FLUOCINOLONE ACETONIDE 0.01 % OT OIL: 2.0000 [drp] | TOPICAL_OIL | Freq: Every day | OTIC | 1 refills | Status: AC | PRN

## 2024-02-04 NOTE — Progress Notes (Signed)
 Established Patient Office Visit  Subjective:  Patient ID: Desiree Hughes, female    DOB: [birth year]  Age: 68 y.o. MRN: [id number]  Chief Complaint  Patient presents with   Sinus Problem    X 2 weeks    Sinus Problem    Discussed the use of AI scribe software for clinical note transcription with the patient, who gave verbal consent to proceed.  History of Present Illness   Desiree Hughes is a 68 year old female who presents with sinus congestion and abnormal olfaction.  She experiences sinus congestion primarily on the left side, which becomes noticeable every fall. There is a 'raunchy' smell, particularly when bending over, and both sides eventually become stuffy, with the left side more affected. This has been ongoing for about three weeks. She experiences headaches associated with her sinus symptoms and uses Flonase  regularly for allergies. When symptoms worsen, she takes mucus relief medication, specifically guaifenesin , which helps break up the congestion. She also takes an allergy tablet regularly.  There is tenderness in the sinus area and a history of psoriasis in her ears, leading to itchiness and occasional ringing. She experiences thick mucus drainage down the back of her throat, causing a cough and sometimes a sore throat upon waking. No shortness of breath, chest pain, or significant wheezing.  She mentions having a temporary dental procedure recently, which has not required antibiotics. She has a history of eczema and seborrheic keratosis, which causes irritation and bleeding.  Her son is diabetic, and she frequently drives him, listening to books on tape during these trips. She has two cats and two dogs, and there is a lot of pollen in her area, contributing to her allergy symptoms.      Patient Active Problem List   Diagnosis Date Noted   Osteopenia 01/11/2022   Primary osteoarthritis of both hips 12/30/2021   DDD (degenerative disc disease), lumbar 03/29/2021    Trigger finger, right middle finger 03/29/2021   Osteoarthritis of patellofemoral joints, bilateral 11/23/2020   Cervical spondylosis 10/28/2020   Grief 11/04/2019   Insomnia 02/08/2016   GAD (generalized anxiety disorder) 12/29/2014   GERD (gastroesophageal reflux disease) 10/07/2014   Chronic pain syndrome 10/07/2014   Bilateral carpal tunnel syndrome 09/22/2014   Obstructive sleep apnea 09/22/2014   Psoriasis 09/22/2014   Past Medical History:  Diagnosis Date   Herniated disc, cervical    Hypertension    Psoriasis    Past Surgical History:  Procedure Laterality Date   BREAST CYST ASPIRATION Left    IR RADIOLOGIST EVAL & MGMT  10/24/2023   TUBAL LIGATION     Social History   Tobacco Use   Smoking status: Never   Smokeless tobacco: Never  Vaping Use   Vaping status: Never Used  Substance Use Topics   Alcohol use: Yes    Comment: Occasionally   Drug use: No      ROS: as noted in HPI  Objective:     BP 121/83   Pulse 70   Temp 97.9 F (36.6 C) (Oral)   Ht 5' 6 (1.676 m)   Wt 165 lb (74.8 kg)   SpO2 99%   BMI 26.63 kg/m  BP Readings from Last 3 Encounters:  02/04/24 121/83  10/24/23 (!) 147/88  02/20/23 113/62   Wt Readings from Last 3 Encounters:  02/04/24 165 lb (74.8 kg)  01/31/24 156 lb 3.2 oz (70.9 kg)  02/20/23 152 lb (68.9 kg)      Physical Exam Vitals and  nursing note reviewed.  Constitutional:      General: She is not in acute distress.    Appearance: Normal appearance. She is not ill-appearing, toxic-appearing or diaphoretic.  HENT:     Head: Normocephalic and atraumatic.     Right Ear: Ear canal normal. No drainage, swelling or tenderness. A middle ear effusion (thick green) is present. There is no impacted cerumen. Tympanic membrane is not injected, scarred, perforated or erythematous.     Left Ear: Ear canal normal. No drainage, swelling or tenderness. A middle ear effusion is present. There is no impacted cerumen. Tympanic membrane is  not injected, scarred, perforated or erythematous.     Ears:     Comments: Eczema ear canal    Nose: Congestion and rhinorrhea present. Rhinorrhea is purulent.     Right Turbinates: Enlarged and swollen.     Left Turbinates: Enlarged and swollen.     Right Sinus: Maxillary sinus tenderness and frontal sinus tenderness present.     Left Sinus: Maxillary sinus tenderness and frontal sinus tenderness present.     Mouth/Throat:     Lips: Pink.     Mouth: Mucous membranes are moist.     Pharynx: Oropharynx is clear. Uvula midline. Postnasal drip present. No pharyngeal swelling, oropharyngeal exudate, posterior oropharyngeal erythema or uvula swelling.  Cardiovascular:     Rate and Rhythm: Normal rate and regular rhythm.  Pulmonary:     Effort: Pulmonary effort is normal. No respiratory distress.     Breath sounds: Normal breath sounds. No stridor. No wheezing, rhonchi or rales.  Musculoskeletal:     Cervical back: Normal range of motion and neck supple. No rigidity or tenderness.  Lymphadenopathy:     Cervical: No cervical adenopathy.  Skin:    General: Skin is warm and dry.     Coloration: Skin is not jaundiced.     Findings: Lesion (scaling lesion to L forehead; numerous SKs across body) present. No bruising, erythema or rash.  Neurological:     General: No focal deficit present.     Mental Status: She is alert and oriented to person, place, and time.     Motor: No weakness.     Gait: Gait normal.      No results found for any visits on 02/04/24.    The 10-year ASCVD risk score (Arnett DK, et al., 2019) is: 7.1%  Assessment & Plan:  Acute recurrent maxillary sinusitis -     Amoxicillin -Pot Clavulanate; Take 1 tablet by mouth 2 (two) times daily with a meal.  Dispense: 20 tablet; Refill: 0 -     predniSONE ; Take 1 tablet (20 mg total) by mouth daily with breakfast.  Dispense: 6 tablet; Refill: 0  Eczema of both external ears -     Fluocinolone  Acetonide; Place 2 drops in  ear(s) daily as needed (both ears).  Dispense: 20 mL; Refill: 1  Skin lesion  Assessment and Plan    Acute sinusitis with right eustachian tube dysfunction Acute sinusitis with persistent symptoms, mucus buildup behind right eardrum, and abnormal olfaction. Differential includes bacterial infection and inflammation. - Prescribed low-dose prednisone  for inflammation, to be taken with breakfast starting tomorrow if picked up after noon. - Prescribed antibiotic for potential bacterial infection, to be started today. - Continue Flonase  nasal spray. - Recommend saline nasal rinses using a neti pot or SinuGator.  Allergic rhinitis Chronic allergic rhinitis exacerbated by environmental allergens. - Continue Flonase  nasal spray. - Continue allergy tablets as needed.  Chronic otic  eczema (bilateral) Chronic bilateral otic eczema with itching and dryness, previously managed with fluocinolone  otic oil. - Prescribe fluocinolone  otic oil as needed.  Seborrheic keratosis (multiple sites) Multiple seborrheic keratoses with one atypical on the forehead, consideration for biopsy due to atypical characteristics. - Recommend biopsy of atypical seborrheic keratosis on forehead.        No follow-ups on file.   Benton LITTIE Gave, PA

## 2024-02-04 NOTE — Patient Instructions (Addendum)
   Please look into purchasing the above product to use for sinus irrigation. Continue your flonase .  Please start taking the antibiotic, Augmentin , twice daily with food. Take it for all 10 days, do not stop early just because you feel better. Take an over the counter probiotic or yogurt daily to help prevent diarrhea/ yeast infection.  Take prednisone  once daily in the morning with breakfast.  It is also recommended that you use nasal saline/ sinus washes to cleans the sinus passages. Hot steam from a shower or vaporizer may also be beneficial to help open up the upper airway. Eucalyptus can be helpful.   Use the otic oil as needed for ear itching.

## 2024-02-18 ENCOUNTER — Other Ambulatory Visit: Payer: Self-pay | Admitting: Family Medicine

## 2024-02-18 ENCOUNTER — Encounter: Payer: Self-pay | Admitting: Family Medicine

## 2024-02-18 ENCOUNTER — Ambulatory Visit: Payer: Medicare (Managed Care) | Admitting: Family Medicine

## 2024-02-18 VITALS — BP 131/69 | HR 70 | Ht 66.0 in | Wt 158.0 lb

## 2024-02-18 DIAGNOSIS — L820 Inflamed seborrheic keratosis: Secondary | ICD-10-CM

## 2024-02-18 DIAGNOSIS — L989 Disorder of the skin and subcutaneous tissue, unspecified: Secondary | ICD-10-CM | POA: Diagnosis not present

## 2024-02-18 NOTE — Progress Notes (Unsigned)
 Established Patient Office Visit  Patient ID: Desiree Hughes, female    DOB: [birth year]  Age: 68 y.o. MRN: [id number] PCP: Alvan Dorothyann BIRCH, MD  Chief Complaint  Patient presents with   Medical Management of Chronic Issues         Subjective:     HPI  Here to have some lesion removed or frozen today. She has 4 lesion on her left face around her eye. They get irritated with her glasses and she says once has started to peel. Larger, brown, thick lesion on the left mid-abdomen. Gets irritated and itchy, bleeds occasionally. Worried that is look different than the other lesion. She has 2 raised hornlike lesion on both calves.      ROS    Objective:     BP 131/69   Pulse 70   Ht 5' 6 (1.676 m)   Wt 158 lb (71.7 kg)   SpO2 98%   BMI 25.50 kg/m     Physical Exam HENT:     Head: Normocephalic and atraumatic.   Abdominal:   Musculoskeletal:       Legs:      No results found for any visits on 02/18/24.    The 10-year ASCVD risk score (Arnett DK, et al., 2019) is: 8.2%    Assessment & Plan:   Problem List Items Addressed This Visit   None Visit Diagnoses       Skin lesion    -  Primary   Relevant Orders   Surgical pathology     Inflamed seborrheic keratosis       Relevant Orders   Surgical pathology       Skin excision  Date/Time: 02/18/2024 3:57 PM  Performed by: Alvan Dorothyann BIRCH, MD Authorized by: Alvan Dorothyann BIRCH, MD   Number of Lesions: 1 Lesion 1:    Body area: trunk   Trunk location: abdomen   Initial size (mm): 10   Final defect size (mm): 9   Malignancy: malignancy unknown     Repair comments: Shave lesion performed.  Used lidocaine 1% to anesthetize the area after cleaning with Hibiclens Skin excision  Date/Time: 02/19/2024 8:01 AM  Performed by: Alvan Dorothyann BIRCH, MD Authorized by: Alvan Dorothyann BIRCH, MD   Number of Lesions: 1 Lesion 1:    Body area: lower extremity   Lower extremity location: L  lower leg   Initial size (mm): 2   Final defect size (mm): 3   Malignancy: malignancy unknown     Repair comments: Shave biopsy performed. Used lidocaine 1% to anesthetize the area after cleaning with alcohol .    Cryotherapy Procedure Note  Pre-operative Diagnosis: irritated seborrheic keratoses  Post-operative Diagnosis: same  Locations: Left side of face around her eye x 4 and one lesion on pos right calf.   Indications: irritation  Anesthesia: None   Procedure Details  Patient informed of risks (permanent scarring, infection, light or dark discoloration, bleeding, infection, weakness, numbness and recurrence of the lesion) and benefits of the procedure and verbal informed consent obtained.  The areas are treated with liquid nitrogen therapy, frozen until ice ball extended 1-2 mm beyond lesion, allowed to thaw, and treated again. The patient tolerated procedure well.  The patient was instructed on post-op care, warned that there may be blister formation, redness and pain. Recommend OTC analgesia as needed for pain.  Condition: Stable  Complications: none.  Plan: 1. Instructed to keep the area dry and covered for 24-48h and clean thereafter.  2. Warning signs of infection were reviewed.   3. Recommended that the patient use OTC acetaminophen  as needed for pain.  4. Return PRN.   F/U wound care discussed.   No follow-ups on file.    Dorothyann Byars, MD [facility] at [facility]

## 2024-02-18 NOTE — Patient Instructions (Signed)
 Keep covered until tomorrow.  Ok to get wet in shower and pat dry and apply vaseline daily until healed.   Do not use alcohol or peroxide.

## 2024-02-21 ENCOUNTER — Ambulatory Visit: Payer: Self-pay | Admitting: Family Medicine

## 2024-02-21 LAB — DERMATOLOGY PATHOLOGY

## 2024-02-21 NOTE — Progress Notes (Signed)
 Hi Desiree Hughes, wanted to give you an update.  The biopsy on the lesion on your stomach came back a seborrheic keratosis that was just irritated and inflamed which is what we suspected.  The one on your calf actually came back as a wart.  Warts are typically caused by a virus and usually eventually will go away on their own.  They do respond well to freezing but sometimes have to be frozen a couple times.  Let me know if you are okay with doing Cologuard for colon cancer screening.

## 2024-02-22 ENCOUNTER — Encounter: Payer: Self-pay | Admitting: Family Medicine

## 2024-02-22 NOTE — Telephone Encounter (Signed)
 Found cologuard In care everywhere - abstracted to chart and hard copy placed in scan folder to be scanned into patient chart.

## 2024-02-22 NOTE — Telephone Encounter (Signed)
 Health maintenance tab still showing a FOBT test showing due-  Should this be removed ?

## 2024-02-25 ENCOUNTER — Other Ambulatory Visit: Payer: Self-pay | Admitting: Family Medicine

## 2024-02-25 DIAGNOSIS — J301 Allergic rhinitis due to pollen: Secondary | ICD-10-CM

## 2024-03-05 ENCOUNTER — Ambulatory Visit: Payer: Self-pay | Admitting: Family Medicine

## 2024-03-05 NOTE — Progress Notes (Signed)
 Great news! Your Cologuard test is negative.  Recommend repeat colon cancer screening in 3 years.

## 2024-03-19 ENCOUNTER — Ambulatory Visit (INDEPENDENT_AMBULATORY_CARE_PROVIDER_SITE_OTHER): Payer: Medicare (Managed Care) | Admitting: Family Medicine

## 2024-03-19 ENCOUNTER — Encounter: Payer: Self-pay | Admitting: Family Medicine

## 2024-03-19 VITALS — BP 133/74 | HR 80 | Ht 66.0 in

## 2024-03-19 DIAGNOSIS — F5101 Primary insomnia: Secondary | ICD-10-CM | POA: Diagnosis not present

## 2024-03-19 DIAGNOSIS — K219 Gastro-esophageal reflux disease without esophagitis: Secondary | ICD-10-CM | POA: Diagnosis not present

## 2024-03-19 DIAGNOSIS — L409 Psoriasis, unspecified: Secondary | ICD-10-CM

## 2024-03-19 DIAGNOSIS — M8580 Other specified disorders of bone density and structure, unspecified site: Secondary | ICD-10-CM | POA: Diagnosis not present

## 2024-03-19 DIAGNOSIS — Z13220 Encounter for screening for lipoid disorders: Secondary | ICD-10-CM

## 2024-03-19 DIAGNOSIS — M170 Bilateral primary osteoarthritis of knee: Secondary | ICD-10-CM | POA: Diagnosis not present

## 2024-03-19 DIAGNOSIS — G894 Chronic pain syndrome: Secondary | ICD-10-CM

## 2024-03-19 DIAGNOSIS — R030 Elevated blood-pressure reading, without diagnosis of hypertension: Secondary | ICD-10-CM | POA: Diagnosis not present

## 2024-03-19 MED ORDER — NYSTATIN-TRIAMCINOLONE 100000-0.1 UNIT/GM-% EX OINT: 1.0000 | TOPICAL_OINTMENT | Freq: Two times a day (BID) | CUTANEOUS | 99 refills | Status: AC

## 2024-03-19 NOTE — Assessment & Plan Note (Signed)
 On Omeprazole  for GERD

## 2024-03-19 NOTE — Assessment & Plan Note (Signed)
  Chronic bilateral knee pain Chronic knee pain with limited relief from previous treatments. Insurance denied nerve ablation. Referral to sports medicine for further evaluation and management. - Referred to sports medicine for further evaluation and management options.

## 2024-03-19 NOTE — Progress Notes (Signed)
 Established Patient Office Visit  Patient ID: Desiree Hughes, female    DOB: 1956-03-01  Age: 68 y.o. MRN: [id number] PCP: Alvan Dorothyann BIRCH, MD  Chief Complaint  Patient presents with   Follow-up    Pt is requesting a physical with fasting labs pt would like med refill, and has concerns for elevated b/p at home with high heart rate    Subjective:     HPI  Discussed the use of AI scribe software for clinical note transcription with the patient, who gave verbal consent to proceed.  History of Present Illness Desiree Hughes is a 68 year old female who presents for a routine check-up and lab work.  Knee and lower extremity pain - Knee pain worsens with standing and somewhat relieved by walking - Pain is most intense at the top of the knee and radiates down the calf - Progressive worsening of pain over the past year - Previous treatments with cortisone and gel injections provided minimal relief - Nerve ablation was considered but not performed due to insurance denial - Pain limits ability to perform physical activities such as yard work and walking  Weight gain and physical inactivity - Weight increased from 140 to 160 pounds over the past year - Weight gain attributed to decreased physical activity and aging - Knee pain limits ability to engage in preferred exercise, especially walking  Sleep disturbance and fatigue - Poor sleep patterns with frequent nighttime awakenings, sometimes to use the restroom - Difficulty returning to sleep after awakening - Takes trazodone  100 mg to 150 mg nightly, which aids sleep but causes morning grogginess - Occasionally takes three tablets after particularly bad nights - Feels tired and less motivated during the day - Consumes two cups of coffee daily, usually before 10 AM - Has considered reducing caffeine intake to improve sleep  Elevated blood pressure and heart rate - Increase in blood pressure from baseline 112/70 mmHg to approximately  135/80 mmHg - Increase in heart rate noted - Attributes changes to recent weight gain and decreased physical activity - Monitors blood pressure at home and often observes a decrease on a second reading  Dietary habits - Craves sweets and struggles with sugar intake - Difficulty controlling sugar consumption, contributing to weight gain     ROS    Objective:     BP 133/74   Pulse 80   Ht 5' 6 (1.676 m)   SpO2 99%   BMI 25.50 kg/m    Physical Exam Constitutional:      Appearance: Normal appearance.  HENT:     Head: Normocephalic and atraumatic.     Right Ear: Tympanic membrane, ear canal and external ear normal.     Left Ear: Tympanic membrane, ear canal and external ear normal.     Nose: Nose normal.     Mouth/Throat:     Pharynx: Oropharynx is clear.  Eyes:     Extraocular Movements: Extraocular movements intact.     Conjunctiva/sclera: Conjunctivae normal.     Pupils: Pupils are equal, round, and reactive to light.  Neck:     Thyroid : No thyromegaly.  Cardiovascular:     Rate and Rhythm: Normal rate and regular rhythm.  Pulmonary:     Effort: Pulmonary effort is normal.     Breath sounds: Normal breath sounds.  Abdominal:     General: Bowel sounds are normal.     Palpations: Abdomen is soft.     Tenderness: There is no abdominal tenderness.  Musculoskeletal:  General: No swelling.     Cervical back: Neck supple.  Skin:    General: Skin is warm and dry.  Neurological:     Mental Status: She is oriented to person, place, and time.  Psychiatric:        Mood and Affect: Mood normal.        Behavior: Behavior normal.      No results found for any visits on 03/19/24.    The 10-year ASCVD risk score (Arnett DK, et al., 2019) is: 8.5%    Assessment & Plan:   Problem List Items Addressed This Visit       Digestive   GERD (gastroesophageal reflux disease)   On Omeprazole  for GERD        Musculoskeletal and Integument   Psoriasis -  Primary   Osteopenia   Relevant Orders   TSH   CBC with Differential   Osteoarthritis of patellofemoral joints, bilateral    Chronic bilateral knee pain Chronic knee pain with limited relief from previous treatments. Insurance denied nerve ablation. Referral to sports medicine for further evaluation and management. - Referred to sports medicine for further evaluation and management options.      Relevant Orders   Ambulatory referral to Sports Medicine     Other   Insomnia   Primary insomnia Chronic insomnia with difficulty maintaining sleep. Discussed increasing trazodone  to 150 mg nightly and potential use of doxepin if ineffective. Advised on reducing caffeine intake. - Increase trazodone  to 150 mg nightly for two weeks. - Consider doxepin if trazodone  is ineffective. - Reduce caffeine intake.      Relevant Medications   nystatin -triamcinolone  ointment (MYCOLOG)   Other Relevant Orders   Lipid Panel With LDL/HDL Ratio   CMP14+EGFR   TSH   CBC with Differential   Chronic pain syndrome   Relevant Medications   nystatin -triamcinolone  ointment (MYCOLOG)   Other Relevant Orders   Lipid Panel With LDL/HDL Ratio   CMP14+EGFR   TSH   CBC with Differential   Other Visit Diagnoses       Screening, lipid       Relevant Orders   Lipid Panel With LDL/HDL Ratio     Elevated BP reading w/ no diagnosis of HTN           Assessment and Plan Assessment & Plan  Routine wellness visit with overdue labs and mammogram. Bone density scan may be due next year. Vaccinations are current. - Ordered labs. - Scheduled mammogram. - Checked bone density scan due date and will schedule if due. - Continue Cologuard until 2027. - Continue tetanus vaccination status.   Elevated BP  Recent increase in blood pressure possibly related to weight gain and decreased physical activity. Discussed impact of weight and exercise on blood pressure. - Continue to monitor blood pressure at home. -  Encouraged weight management and increased physical activity.    Return in about 6 months (around 09/16/2024) for Chronic Pain Mgt.    Dorothyann Byars, MD [facility] & [facility] Medicine at [facility]

## 2024-03-19 NOTE — Assessment & Plan Note (Signed)
 Primary insomnia Chronic insomnia with difficulty maintaining sleep. Discussed increasing trazodone  to 150 mg nightly and potential use of doxepin if ineffective. Advised on reducing caffeine intake. - Increase trazodone  to 150 mg nightly for two weeks. - Consider doxepin if trazodone  is ineffective. - Reduce caffeine intake.

## 2024-03-20 LAB — CBC WITH DIFFERENTIAL/PLATELET
Basophils Absolute: 0.1 x10E3/uL (ref 0.0–0.2)
Basos: 1 %
EOS (ABSOLUTE): 0.2 x10E3/uL (ref 0.0–0.4)
Eos: 3 %
Hematocrit: 40.1 % (ref 34.0–46.6)
Hemoglobin: 13.2 g/dL (ref 11.1–15.9)
Immature Grans (Abs): 0 x10E3/uL (ref 0.0–0.1)
Immature Granulocytes: 0 %
Lymphocytes Absolute: 1.9 x10E3/uL (ref 0.7–3.1)
Lymphs: 32 %
MCH: 31.5 pg (ref 26.6–33.0)
MCHC: 32.9 g/dL (ref 31.5–35.7)
MCV: 96 fL (ref 79–97)
Monocytes Absolute: 0.6 x10E3/uL (ref 0.1–0.9)
Monocytes: 9 %
Neutrophils Absolute: 3.2 x10E3/uL (ref 1.4–7.0)
Neutrophils: 55 %
Platelets: 280 x10E3/uL (ref 150–450)
RBC: 4.19 x10E6/uL (ref 3.77–5.28)
RDW: 12.3 % (ref 11.7–15.4)
WBC: 5.9 x10E3/uL (ref 3.4–10.8)

## 2024-03-20 LAB — CMP14+EGFR
ALT: 14 IU/L (ref 0–32)
AST: 17 IU/L (ref 0–40)
Albumin: 4.5 g/dL (ref 3.9–4.9)
Alkaline Phosphatase: 55 IU/L (ref 49–135)
BUN/Creatinine Ratio: 17 (ref 12–28)
BUN: 11 mg/dL (ref 8–27)
Bilirubin Total: 0.4 mg/dL (ref 0.0–1.2)
CO2: 24 mmol/L (ref 20–29)
Calcium: 9.7 mg/dL (ref 8.7–10.3)
Chloride: 99 mmol/L (ref 96–106)
Creatinine, Ser: 0.65 mg/dL (ref 0.57–1.00)
Globulin, Total: 2.6 g/dL (ref 1.5–4.5)
Glucose: 93 mg/dL (ref 70–99)
Potassium: 4.5 mmol/L (ref 3.5–5.2)
Sodium: 139 mmol/L (ref 134–144)
Total Protein: 7.1 g/dL (ref 6.0–8.5)
eGFR: 95 mL/min/1.73 (ref >59)

## 2024-03-20 LAB — LIPID PANEL WITH LDL/HDL RATIO
Cholesterol, Total: 237 mg/dL — ABNORMAL HIGH (ref 100–199)
HDL: 97 mg/dL (ref >39)
LDL Chol Calc (NIH): 126 mg/dL — ABNORMAL HIGH (ref 0–99)
LDL/HDL Ratio: 1.3 ratio (ref 0.0–3.2)
Triglycerides: 81 mg/dL (ref 0–149)
VLDL Cholesterol Cal: 14 mg/dL (ref 5–40)

## 2024-03-20 LAB — TSH: TSH: 1.36 u[IU]/mL (ref 0.450–4.500)

## 2024-03-24 ENCOUNTER — Ambulatory Visit: Payer: Self-pay | Admitting: Family Medicine

## 2024-03-24 NOTE — Progress Notes (Signed)
 Hi Margot, your total cholesterol and LDL are elevated.  Just encourage you to continue to work on healthy Mediterranean diet and regular exercise for 30 minutes, 5 days/week.  Metabolic panel including liver and kidney is normal.  Thyroid  looks great.  And blood count shows no anemia.  The 10-year ASCVD risk score (Arnett DK, et al., 2019) is: 8.5%   Values used to calculate the score:     Age: 68 years     Clincally relevant sex: Female     Is Non-Hispanic African American: No     Diabetic: No     Tobacco smoker: No     Systolic Blood Pressure: 133 mmHg     Is BP treated: No     HDL Cholesterol: 97 mg/dL     Total Cholesterol: 237 mg/dL

## 2024-04-22 ENCOUNTER — Other Ambulatory Visit: Payer: Self-pay | Admitting: Family Medicine

## 2024-04-22 DIAGNOSIS — F5101 Primary insomnia: Secondary | ICD-10-CM

## 2024-04-22 MED ORDER — TRAZODONE HCL 50 MG PO TABS: 150.0000 mg | ORAL_TABLET | Freq: Every day | ORAL | 3 refills | Status: AC

## 2024-04-22 NOTE — Telephone Encounter (Signed)
 Requesting rx rf of Trazodone  50mg  Last written 10/19/2022 Last OV 03/19/2024 Upcoming appt 09/17/2024

## 2024-04-22 NOTE — Telephone Encounter (Unsigned)
 Copied from CRM [id number]. Topic: Clinical - Medication Refill >> Apr 22, 2024  2:03 PM Victoria B wrote: Medication: traZODone  (DESYREL ) 50 MG tablet  Has the patient contacted their pharmacy? yes (Agent: If no, request that the patient contact the pharmacy for the refill. If patient does not wish to contact the pharmacy document the reason why and proceed with request.) (Agent: If yes, when and what did the pharmacy advise?)contact pcp  This is the patient's preferred pharmacy:    [facility] - [place], [place] - [address] [place] [address] Phone: [phone] Fax: [phone]  Is this the correct pharmacy for this prescription? yes   Has the prescription been filled recently? no  Is the patient out of the medication? no  Has the patient been seen for an appointment in the last year OR does the patient have an upcoming appointment? yes  Can we respond through MyChart? no  Agent: Please be advised that Rx refills may take up to 3 business days. We ask that you follow-up with your pharmacy.

## 2024-05-12 ENCOUNTER — Other Ambulatory Visit: Payer: Self-pay | Admitting: Family Medicine

## 2024-05-12 DIAGNOSIS — M170 Bilateral primary osteoarthritis of knee: Secondary | ICD-10-CM

## 2024-05-12 NOTE — Telephone Encounter (Signed)
 Copied from CRM [id number]. Topic: Clinical - Medication Refill >> May 12, 2024 10:46 AM Aleatha C wrote: Medication:  traMADol  (ULTRAM ) 50 MG tablet   Has the patient contacted their pharmacy? Yes (Agent: If no, request that the patient contact the pharmacy for the refill. If patient does not wish to contact the pharmacy document the reason why and proceed with request.) (Agent: If yes, when and what did the pharmacy advise?)  This is the patient's preferred pharmacy:    [facility] - [place], [place] - [address] [place] [address] Phone: [phone] Fax: [phone]  Is this the correct pharmacy for this prescription? Yes If no, delete pharmacy and type the correct one.   Has the prescription been filled recently? No  Is the patient out of the medication? No  Has the patient been seen for an appointment in the last year OR does the patient have an upcoming appointment? Yes  Can we respond through MyChart? No  Agent: Please be advised that Rx refills may take up to 3 business days. We ask that you follow-up with your pharmacy.

## 2024-05-13 MED ORDER — TRAMADOL HCL 50 MG PO TABS: 50.0000 mg | ORAL_TABLET | Freq: Three times a day (TID) | ORAL | 2 refills | Status: AC | PRN

## 2024-09-17 ENCOUNTER — Ambulatory Visit: Payer: Medicare (Managed Care) | Admitting: Family Medicine

## 2025-02-03 ENCOUNTER — Ambulatory Visit: Payer: Medicare (Managed Care)
